# Patient Record
Sex: Female | Born: 1980
Health system: Southern US, Community
[De-identification: ages and names within clinical notes are randomized; demographics above are authoritative.]

## PROBLEM LIST (undated history)

## (undated) DIAGNOSIS — M545 Low back pain, unspecified: Secondary | ICD-10-CM

## (undated) DIAGNOSIS — F32A Depression, unspecified: Secondary | ICD-10-CM

## (undated) DIAGNOSIS — N289 Disorder of kidney and ureter, unspecified: Secondary | ICD-10-CM

## (undated) DIAGNOSIS — F419 Anxiety disorder, unspecified: Secondary | ICD-10-CM

## (undated) HISTORY — DX: Low back pain: M54.5

## (undated) HISTORY — PX: MANDIBLE FRACTURE SURGERY: SHX706

## (undated) HISTORY — PX: HIP SURGERY: SHX245

## (undated) HISTORY — DX: Low back pain, unspecified: M54.50

## (undated) HISTORY — DX: Depression, unspecified: F32.A

## (undated) HISTORY — DX: Anxiety disorder, unspecified: F41.9

## (undated) HISTORY — PX: JOINT REPLACEMENT: SHX530

---
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
## 2002-09-03 ENCOUNTER — Encounter: Payer: Self-pay | Admitting: General Surgery

## 2002-09-03 ENCOUNTER — Inpatient Hospital Stay (HOSPITAL_COMMUNITY): Admission: AC | Admit: 2002-09-03 | Discharge: 2002-09-12 | Payer: Self-pay

## 2002-09-07 ENCOUNTER — Encounter: Payer: Self-pay | Admitting: Orthopedic Surgery

## 2003-06-30 ENCOUNTER — Inpatient Hospital Stay (HOSPITAL_COMMUNITY): Admission: RE | Admit: 2003-06-30 | Discharge: 2003-07-03 | Payer: Self-pay | Admitting: Psychiatry

## 2003-07-27 ENCOUNTER — Other Ambulatory Visit: Admission: RE | Admit: 2003-07-27 | Discharge: 2003-07-27 | Payer: Self-pay | Admitting: Internal Medicine

## 2003-11-08 ENCOUNTER — Emergency Department (HOSPITAL_COMMUNITY): Admission: EM | Admit: 2003-11-08 | Discharge: 2003-11-08 | Payer: Self-pay | Admitting: Emergency Medicine

## 2004-04-20 ENCOUNTER — Other Ambulatory Visit: Admission: RE | Admit: 2004-04-20 | Discharge: 2004-04-20 | Payer: Self-pay | Admitting: Obstetrics and Gynecology

## 2004-08-03 ENCOUNTER — Inpatient Hospital Stay (HOSPITAL_COMMUNITY): Admission: AD | Admit: 2004-08-03 | Discharge: 2004-08-03 | Payer: Self-pay | Admitting: Obstetrics and Gynecology

## 2004-08-04 ENCOUNTER — Ambulatory Visit (HOSPITAL_COMMUNITY): Admission: RE | Admit: 2004-08-04 | Discharge: 2004-08-04 | Payer: Self-pay | Admitting: Obstetrics and Gynecology

## 2004-08-06 ENCOUNTER — Inpatient Hospital Stay (HOSPITAL_COMMUNITY): Admission: AD | Admit: 2004-08-06 | Discharge: 2004-08-06 | Payer: Self-pay | Admitting: Obstetrics and Gynecology

## 2004-08-23 ENCOUNTER — Observation Stay: Payer: Self-pay | Admitting: Unknown Physician Specialty

## 2004-10-30 ENCOUNTER — Inpatient Hospital Stay (HOSPITAL_COMMUNITY): Admission: AD | Admit: 2004-10-30 | Discharge: 2004-10-30 | Payer: Self-pay | Admitting: Obstetrics and Gynecology

## 2004-11-04 ENCOUNTER — Inpatient Hospital Stay (HOSPITAL_COMMUNITY): Admission: AD | Admit: 2004-11-04 | Discharge: 2004-11-04 | Payer: Self-pay | Admitting: Obstetrics and Gynecology

## 2004-11-06 ENCOUNTER — Inpatient Hospital Stay (HOSPITAL_COMMUNITY): Admission: AD | Admit: 2004-11-06 | Discharge: 2004-11-10 | Payer: Self-pay | Admitting: Obstetrics and Gynecology

## 2004-11-07 ENCOUNTER — Encounter (INDEPENDENT_AMBULATORY_CARE_PROVIDER_SITE_OTHER): Payer: Self-pay | Admitting: Specialist

## 2005-06-19 ENCOUNTER — Other Ambulatory Visit: Admission: RE | Admit: 2005-06-19 | Discharge: 2005-06-19 | Payer: Self-pay | Admitting: Obstetrics and Gynecology

## 2005-07-06 ENCOUNTER — Ambulatory Visit: Payer: Self-pay | Admitting: Internal Medicine

## 2005-07-08 ENCOUNTER — Emergency Department (HOSPITAL_COMMUNITY): Admission: EM | Admit: 2005-07-08 | Discharge: 2005-07-09 | Payer: Self-pay | Admitting: Emergency Medicine

## 2005-07-10 ENCOUNTER — Ambulatory Visit: Payer: Self-pay | Admitting: Hospitalist

## 2005-08-29 ENCOUNTER — Encounter: Payer: Self-pay | Admitting: Emergency Medicine

## 2005-10-11 ENCOUNTER — Ambulatory Visit: Payer: Self-pay | Admitting: Internal Medicine

## 2007-08-18 ENCOUNTER — Emergency Department (HOSPITAL_COMMUNITY): Admission: EM | Admit: 2007-08-18 | Discharge: 2007-08-19 | Payer: Self-pay | Admitting: Emergency Medicine

## 2010-04-18 ENCOUNTER — Emergency Department: Payer: Self-pay | Admitting: Emergency Medicine

## 2010-07-20 ENCOUNTER — Emergency Department (HOSPITAL_COMMUNITY)
Admission: EM | Admit: 2010-07-20 | Discharge: 2010-07-20 | Disposition: A | Discharge status: Home / Self Care | Payer: Medicaid Other | Attending: Emergency Medicine | Admitting: Emergency Medicine

## 2010-07-20 ENCOUNTER — Emergency Department: Payer: Self-pay | Admitting: Unknown Physician Specialty

## 2010-07-20 DIAGNOSIS — T6391XA Toxic effect of contact with unspecified venomous animal, accidental (unintentional), initial encounter: Secondary | ICD-10-CM | POA: Insufficient documentation

## 2010-07-20 DIAGNOSIS — IMO0002 Reserved for concepts with insufficient information to code with codable children: Secondary | ICD-10-CM | POA: Insufficient documentation

## 2010-07-20 DIAGNOSIS — T63461A Toxic effect of venom of wasps, accidental (unintentional), initial encounter: Secondary | ICD-10-CM | POA: Insufficient documentation

## 2010-07-20 DIAGNOSIS — M7989 Other specified soft tissue disorders: Secondary | ICD-10-CM | POA: Insufficient documentation

## 2010-07-20 DIAGNOSIS — M79609 Pain in unspecified limb: Secondary | ICD-10-CM | POA: Insufficient documentation
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
## 2010-09-15 NOTE — Op Note (Signed)
[name], [name]                ACCOUNT NO.:  [id number]   MEDICAL RECORD NO.:  [id number]          PATIENT TYPE:  INP   LOCATION:  9105                          FACILITY:  [facility]   PHYSICIAN:  Osborn Coho, M.D.   DATE OF BIRTH:  1981-04-10   DATE OF PROCEDURE:  11/07/2004  DATE OF DISCHARGE:                                 OPERATIVE REPORT   PREOPERATIVE DIAGNOSES:  1.  Intrauterine pregnancy.  2.  Failure of descent.  3.  Chorioamnionitis.  4.  Fetal tachycardia.   POSTOPERATIVE DIAGNOSES:  1.  Intrauterine pregnancy.  2.  Failure of descent.  3.  Chorioamnionitis.  4.  Fetal tachycardia.   OPERATION/PROCEDURE:  Primary low transverse cesarean section via  Pfannenstiel skin incision.   ANESTHESIA:  Epidural.   SURGEON:  Osborn Coho, M.D.   ASSISTANT:  Cam Hai, C.N.M.   IV FLUIDS:  2600 mL.   ESTIMATED BLOOD LOSS:  750 mL.   URINARY OUTPUT:  300 mL.   COMPLICATIONS:  None.   FINDINGS:  Live female infant with Apgars of 9 at one minute and 9 at five  minutes, [name], normal-appearing bilateral ovaries and fallopian  tubes, weight 9 pounds 1 ounce.   DESCRIPTION OF PROCEDURE:  The patient the taken to the operating room after  the risks, benefits and alternatives had been discussed with the patient.  The patient verbalized her understanding and consent was signed and  witnessed.  The patient was prepped and draped and given a surgical level of  anesthesia via the epidural.  A Pfannenstiel skin incision was made and  carried down to the underlying layer of fascia with the Bovie.  The fascia  was excised bilaterally in the midline with the Bovie and extended  bilaterally with the Mayo scissors. Kocher clamps were placed on the  inferior aspect of the fascial incision and the rectus muscle excised from  the fascia.  The same was done on the superior aspect of the fascial  incision.  The muscle was separated in the midline and the peritoneum  entered bluntly and extended manually.  Bladder flap created with the  Metzenbaum scissors after bladder blade placed for retraction.  Uterine  incision was made with the scalpel and extended bilaterally with the bandage  scissors.  Infant was delivered in the [facility] presentation.  The infant's  oropharynx and nasopharynx were bulb suctioned.  The cord clamped and cut  after the infant was delivered.  The infant was handed to the waiting  pediatricians.  The uterus was cleared of all clots and debris after the  placenta was delivered via fundal massage.  Cord bloods were collected.  The  uterine incision was repaired with 0 Vicryl in a running locked fashion and  a second imbricating layer was performed.  The peritoneum was repaired with  3-0 chromic in a running fashion.  The fascia was repaired with 0 Vicryl in  a running fashion.  The subcutaneous tissue was irrigated and made  hemostatic with the Bovie.  The skin was closed with staples.  The patient  tolerated the  procedure well and is currently being transferred to the  recovery room in good condition.       AR/MEDQ  D:  11/07/2004  T:  11/08/2004  Job:  [id number]
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
## 2010-09-15 NOTE — Discharge Summary (Signed)
[name], [name]                ACCOUNT NO.:  [id number]   MEDICAL RECORD NO.:  [id number]          PATIENT TYPE:  INP   LOCATION:  9105                          FACILITY:  [facility]   PHYSICIAN:  Hal Morales, M.D.DATE OF BIRTH:  Sep 13, 1980   DATE OF ADMISSION:  11/06/2004  DATE OF DISCHARGE:                                 DISCHARGE SUMMARY   ADMISSION DIAGNOSES:  1.  Intrauterine pregnancy at 40 weeks.  2.  Pregnancy-induced hypertension.  3.  Group B streptococcus negative.   DISCHARGE DIAGNOSES:  1.  Intrauterine pregnancy at 40 weeks.  2.  Pregnancy-induced hypertension.  3.  Group B streptococcus negative.  4.  Chorioamnionitis.  5.  Fetal tachycardia.  6.  Failure to descend.  7.  Status post primary low transverse cesarean section of a female infant      named [name], Apgars 9 and 9, weighing 9 pounds 1 ounce.   HOSPITAL PROCEDURES:  1.  Cervidil cervical ripening.  2.  Pitocin induction of labor.  3.  Electronic fetal monitoring.  4.  Epidural anesthesia.  5.  Primary low transverse cesarean section.   HOSPITAL COURSE:  The patient was admitted secondary to pregnancy-induced  hypertension. She had mild elevations of her blood pressure, 140/90. A 24-  hour urine was negative. Cervix on admission was 1-2, 75%, and -2. She was  given Cervidil overnight to ripen the cervix, followed by Pitocin the next  morning. She progressed to complete dilation and pushed for 2 hours with no  descent past +2 station. At that time she developed a fever, fetal  tachycardia, and decision was made to proceed with an elective primary low  transverse cesarean section which was performed under epidural anesthesia by  Dr. Su Hilt with no complications. Estimated blood loss was 750 mL.  On  postoperative day #1 she was doing well, baby was bottle feeding. She was  getting out of bed without any dizziness. Hemoglobin was 8.4. Orthostatic  blood pressures were within normal limits. On  postoperative day #2 she was  improving, getting up out of bed more. Blood pressure was normal, 113/63,  and routine care was continued. On postoperative day #3 she was ready to go  home. She was requesting Ambien for a history of recent insomnia. She was  bottle-feeding her infant and had plans for her husband to get up with the  baby at night. Vital signs were stable. Chest clear. Heart rate regular rate  and rhythm. Abdomen soft and appropriately tender. Incision was clean with  staples and intact. Lochia was small. Extremities within normal limits. She  was deemed to have received the full benefit of her hospital stay and was  discharged home.   CONDITION AT DISCHARGE:  Good.   DISCHARGE MEDICATIONS:  1.  Motrin 600 mg p.o. q.6h. p.r.n.  2.  Tylox one to two p.o. q.4h. p.r.n.  3.  Ambien one p.o. q.h.s. p.r.n. #10 no refills.   DISCHARGE LABORATORY DATA:  White blood cell count 12.1, hemoglobin 8.4,  platelets 165.   DISCHARGE INSTRUCTIONS:  Per [facility] handout.  DISCHARGE FOLLOW-UP:  In 6 weeks or p.r.n.       MLW/MEDQ  D:  11/10/2004  T:  11/10/2004  Job:  [id number]
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
## 2010-12-25 ENCOUNTER — Emergency Department (HOSPITAL_COMMUNITY)
Admission: EM | Admit: 2010-12-25 | Discharge: 2010-12-25 | Disposition: A | Discharge status: Home / Self Care | Payer: Medicaid Other | Attending: Emergency Medicine | Admitting: Emergency Medicine

## 2010-12-25 DIAGNOSIS — R109 Unspecified abdominal pain: Secondary | ICD-10-CM | POA: Insufficient documentation

## 2010-12-25 DIAGNOSIS — Z87442 Personal history of urinary calculi: Secondary | ICD-10-CM | POA: Insufficient documentation

## 2010-12-25 DIAGNOSIS — R110 Nausea: Secondary | ICD-10-CM | POA: Insufficient documentation

## 2010-12-25 DIAGNOSIS — N200 Calculus of kidney: Secondary | ICD-10-CM | POA: Insufficient documentation

## 2010-12-25 LAB — URINALYSIS, ROUTINE W REFLEX MICROSCOPIC
Bilirubin Urine: NEGATIVE
Glucose, UA: NEGATIVE mg/dL
Ketones, ur: NEGATIVE mg/dL
Nitrite: NEGATIVE
Protein, ur: 30 mg/dL — AB
Specific Gravity, Urine: 1.027 (ref 1.005–1.030)
Urobilinogen, UA: 1 mg/dL (ref 0.0–1.0)
pH: 7.5 (ref 5.0–8.0)

## 2010-12-25 LAB — POCT PREGNANCY, URINE: Preg Test, Ur: NEGATIVE

## 2010-12-25 LAB — POCT I-STAT, CHEM 8
BUN: 9 mg/dL (ref 6–23)
Calcium, Ion: 1.24 mmol/L (ref 1.12–1.32)
Chloride: 105 mEq/L (ref 96–112)
Creatinine, Ser: 0.7 mg/dL (ref 0.50–1.10)
Glucose, Bld: 85 mg/dL (ref 70–99)
HCT: 37 % (ref 36.0–46.0)
Hemoglobin: 12.6 g/dL (ref 12.0–15.0)
Potassium: 4.3 mEq/L (ref 3.5–5.1)
Sodium: 142 mEq/L (ref 135–145)
TCO2: 26 mmol/L (ref 0–100)

## 2010-12-25 LAB — URINE MICROSCOPIC-ADD ON

## 2011-01-23 LAB — URINALYSIS, ROUTINE W REFLEX MICROSCOPIC
Bilirubin Urine: NEGATIVE
Glucose, UA: NEGATIVE
Ketones, ur: 15 — AB
Nitrite: NEGATIVE
Protein, ur: NEGATIVE
Specific Gravity, Urine: 1.02
Urobilinogen, UA: 1
pH: 8

## 2011-01-23 LAB — URINE MICROSCOPIC-ADD ON

## 2011-01-23 LAB — DIFFERENTIAL
Basophils Absolute: 0
Basophils Relative: 0
Eosinophils Absolute: 0
Eosinophils Relative: 0
Lymphocytes Relative: 11 — ABNORMAL LOW
Lymphs Abs: 1.1
Monocytes Absolute: 0.4
Monocytes Relative: 4
Neutro Abs: 9.2 — ABNORMAL HIGH
Neutrophils Relative %: 85 — ABNORMAL HIGH

## 2011-01-23 LAB — CBC
HCT: 32.6 — ABNORMAL LOW
Hemoglobin: 11.2 — ABNORMAL LOW
MCHC: 34.2
MCV: 88
Platelets: 205
RBC: 3.71 — ABNORMAL LOW
RDW: 12.9
WBC: 10.8 — ABNORMAL HIGH

## 2011-01-23 LAB — WET PREP, GENITAL
Clue Cells Wet Prep HPF POC: NONE SEEN
Trich, Wet Prep: NONE SEEN
WBC, Wet Prep HPF POC: NONE SEEN
Yeast Wet Prep HPF POC: NONE SEEN

## 2011-01-23 LAB — BASIC METABOLIC PANEL
BUN: 7
CO2: 26
Calcium: 8.5
Chloride: 106
Creatinine, Ser: 0.74
GFR calc Af Amer: >60
GFR calc non Af Amer: >60
Glucose, Bld: 123 — ABNORMAL HIGH
Potassium: 3.1 — ABNORMAL LOW
Sodium: 141

## 2011-01-23 LAB — GC/CHLAMYDIA PROBE AMP, GENITAL
Chlamydia, DNA Probe: NEGATIVE
GC Probe Amp, Genital: NEGATIVE

## 2011-01-23 LAB — POCT PREGNANCY, URINE
Operator id: 24446
Preg Test, Ur: NEGATIVE

## 2011-07-11 ENCOUNTER — Ambulatory Visit (INDEPENDENT_AMBULATORY_CARE_PROVIDER_SITE_OTHER): Payer: Medicaid Other | Admitting: Obstetrics and Gynecology

## 2011-07-11 DIAGNOSIS — Z0000 Encounter for general adult medical examination without abnormal findings: Secondary | ICD-10-CM

## 2011-07-11 DIAGNOSIS — N898 Other specified noninflammatory disorders of vagina: Secondary | ICD-10-CM

## 2011-12-22 ENCOUNTER — Emergency Department (HOSPITAL_COMMUNITY)
Admission: EM | Admit: 2011-12-22 | Discharge: 2011-12-23 | Disposition: A | Discharge status: Home / Self Care | Payer: Medicaid Other | Attending: Emergency Medicine | Admitting: Emergency Medicine

## 2011-12-22 ENCOUNTER — Encounter (HOSPITAL_COMMUNITY): Payer: Self-pay | Admitting: Emergency Medicine

## 2011-12-22 DIAGNOSIS — M5431 Sciatica, right side: Secondary | ICD-10-CM

## 2011-12-22 DIAGNOSIS — N289 Disorder of kidney and ureter, unspecified: Secondary | ICD-10-CM | POA: Insufficient documentation

## 2011-12-22 DIAGNOSIS — F17200 Nicotine dependence, unspecified, uncomplicated: Secondary | ICD-10-CM | POA: Insufficient documentation

## 2011-12-22 DIAGNOSIS — M5430 Sciatica, unspecified side: Secondary | ICD-10-CM | POA: Insufficient documentation

## 2011-12-22 HISTORY — DX: Disorder of kidney and ureter, unspecified: N28.9

## 2011-12-22 NOTE — ED Notes (Signed)
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

## 2011-12-23 MED ORDER — IBUPROFEN 800 MG PO TABS: 400.0000 mg | ORAL_TABLET | Freq: Three times a day (TID) | ORAL | Status: AC

## 2011-12-23 MED ORDER — OXYCODONE-ACETAMINOPHEN 5-325 MG PO TABS
2.0000 | ORAL_TABLET | Freq: Once | ORAL | Status: AC
  Administered 2011-12-23: 2 via ORAL
  Filled 2011-12-23: qty 2

## 2011-12-23 MED ORDER — OXYCODONE-ACETAMINOPHEN 5-325 MG PO TABS: 2.0000 | ORAL_TABLET | ORAL | Status: AC | PRN
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
## 2011-12-23 NOTE — ED Provider Notes (Signed)
Medical screening examination/treatment/procedure(s) were performed by non-physician practitioner and as supervising physician I was immediately available for consultation/collaboration.   Lyanne Co, MD 12/23/11 0110

## 2012-07-02 ENCOUNTER — Telehealth: Payer: Self-pay

## 2012-07-02 NOTE — Telephone Encounter (Signed)
Ok to [employer] FE tabs X2 called to [facility] [address] Melody Comas A

## 2012-07-04 ENCOUNTER — Emergency Department: Payer: Self-pay | Admitting: Emergency Medicine

## 2012-07-04 LAB — ETHANOL
Ethanol %: <0.003 % (ref 0.000–0.080)
Ethanol: <3 mg/dL

## 2012-07-04 LAB — DRUG SCREEN, URINE
Amphetamines, Ur Screen: NEGATIVE (ref ?–1000)
Barbiturates, Ur Screen: NEGATIVE (ref ?–200)
Benzodiazepine, Ur Scrn: NEGATIVE (ref ?–200)
Cannabinoid 50 Ng, Ur ~~LOC~~: NEGATIVE (ref ?–50)
Cocaine Metabolite,Ur ~~LOC~~: NEGATIVE (ref ?–300)
MDMA (Ecstasy)Ur Screen: NEGATIVE (ref ?–500)
Methadone, Ur Screen: NEGATIVE (ref ?–300)
Opiate, Ur Screen: POSITIVE (ref ?–300)
Phencyclidine (PCP) Ur S: NEGATIVE (ref ?–25)
Tricyclic, Ur Screen: NEGATIVE (ref ?–1000)

## 2012-09-19 ENCOUNTER — Other Ambulatory Visit: Payer: Self-pay | Admitting: Orthopedic Surgery

## 2012-09-19 DIAGNOSIS — M25572 Pain in left ankle and joints of left foot: Secondary | ICD-10-CM

## 2012-09-24 ENCOUNTER — Ambulatory Visit
Admission: RE | Admit: 2012-09-24 | Discharge: 2012-09-24 | Disposition: A | Discharge status: Home / Self Care | Payer: Medicaid Other | Source: Ambulatory Visit | Attending: Orthopedic Surgery | Admitting: Orthopedic Surgery

## 2012-09-24 DIAGNOSIS — M25572 Pain in left ankle and joints of left foot: Secondary | ICD-10-CM

## 2014-04-30 LAB — HM PAP SMEAR: HM Pap smear: NORMAL

## 2015-04-20 ENCOUNTER — Encounter: Payer: Self-pay | Admitting: Internal Medicine

## 2015-04-20 ENCOUNTER — Ambulatory Visit (INDEPENDENT_AMBULATORY_CARE_PROVIDER_SITE_OTHER): Payer: 59 | Admitting: Internal Medicine

## 2015-04-20 VITALS — BP 118/68 | HR 71 | Temp 98.0°F | Resp 20 | Ht 66.5 in | Wt 140.8 lb

## 2015-04-20 DIAGNOSIS — Z716 Tobacco abuse counseling: Secondary | ICD-10-CM | POA: Diagnosis not present

## 2015-04-20 DIAGNOSIS — Z3041 Encounter for surveillance of contraceptive pills: Secondary | ICD-10-CM | POA: Diagnosis not present

## 2015-04-20 DIAGNOSIS — Z720 Tobacco use: Secondary | ICD-10-CM | POA: Diagnosis not present

## 2015-04-20 DIAGNOSIS — M25552 Pain in left hip: Secondary | ICD-10-CM | POA: Diagnosis not present

## 2015-04-20 DIAGNOSIS — R200 Anesthesia of skin: Secondary | ICD-10-CM

## 2015-04-20 DIAGNOSIS — M545 Low back pain, unspecified: Secondary | ICD-10-CM

## 2015-04-20 DIAGNOSIS — R208 Other disturbances of skin sensation: Secondary | ICD-10-CM

## 2015-04-20 DIAGNOSIS — G8929 Other chronic pain: Secondary | ICD-10-CM
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
## 2015-04-20 NOTE — Patient Instructions (Signed)
Continue current medications as ordered  Will call with lab results  Get old records from previous PCP  Follow up in 1 mo for CPE

## 2015-04-21 LAB — CBC WITH DIFFERENTIAL/PLATELET
Basophils Absolute: 0 10*3/uL (ref 0.0–0.2)
Basos: 1 %
EOS (ABSOLUTE): 0.2 10*3/uL (ref 0.0–0.4)
Eos: 3 %
Hematocrit: 41.2 % (ref 34.0–46.6)
Hemoglobin: 13.9 g/dL (ref 11.1–15.9)
Immature Grans (Abs): 0 10*3/uL (ref 0.0–0.1)
Immature Granulocytes: 0 %
Lymphocytes Absolute: 1.6 10*3/uL (ref 0.7–3.1)
Lymphs: 25 %
MCH: 30 pg (ref 26.6–33.0)
MCHC: 33.7 g/dL (ref 31.5–35.7)
MCV: 89 fL (ref 79–97)
Monocytes Absolute: 0.4 10*3/uL (ref 0.1–0.9)
Monocytes: 7 %
Neutrophils Absolute: 4.2 10*3/uL (ref 1.4–7.0)
Neutrophils: 64 %
Platelets: 237 10*3/uL (ref 150–379)
RBC: 4.63 x10E6/uL (ref 3.77–5.28)
RDW: 13.4 % (ref 12.3–15.4)
WBC: 6.5 10*3/uL (ref 3.4–10.8)

## 2015-04-21 LAB — LIPID PANEL
Chol/HDL Ratio: 3 ratio units (ref 0.0–4.4)
Cholesterol, Total: 151 mg/dL (ref 100–199)
HDL: 51 mg/dL (ref >39)
LDL Calculated: 75 mg/dL (ref 0–99)
Triglycerides: 124 mg/dL (ref 0–149)
VLDL Cholesterol Cal: 25 mg/dL (ref 5–40)

## 2015-04-21 LAB — COMPREHENSIVE METABOLIC PANEL
ALT: 17 IU/L (ref 0–32)
AST: 14 IU/L (ref 0–40)
Albumin/Globulin Ratio: 1.9 (ref 1.1–2.5)
Albumin: 4.1 g/dL (ref 3.5–5.5)
Alkaline Phosphatase: 66 IU/L (ref 39–117)
BUN/Creatinine Ratio: 11 (ref 8–20)
BUN: 7 mg/dL (ref 6–20)
Bilirubin Total: 0.5 mg/dL (ref 0.0–1.2)
CO2: 26 mmol/L (ref 18–29)
Calcium: 9 mg/dL (ref 8.7–10.2)
Chloride: 101 mmol/L (ref 96–106)
Creatinine, Ser: 0.64 mg/dL (ref 0.57–1.00)
GFR calc Af Amer: 137 mL/min/{1.73_m2} (ref >59)
GFR calc non Af Amer: 118 mL/min/{1.73_m2} (ref >59)
Globulin, Total: 2.2 g/dL (ref 1.5–4.5)
Glucose: 90 mg/dL (ref 65–99)
Potassium: 4 mmol/L (ref 3.5–5.2)
Sodium: 141 mmol/L (ref 134–144)
Total Protein: 6.3 g/dL (ref 6.0–8.5)

## 2015-04-21 LAB — TSH: TSH: 2.64 u[IU]/mL (ref 0.450–4.500)

## 2015-05-04 ENCOUNTER — Ambulatory Visit: Payer: Self-pay | Admitting: Internal Medicine

## 2015-06-15 ENCOUNTER — Encounter: Payer: Self-pay | Admitting: Internal Medicine

## 2015-06-15 ENCOUNTER — Ambulatory Visit (INDEPENDENT_AMBULATORY_CARE_PROVIDER_SITE_OTHER): Payer: 59 | Admitting: Internal Medicine

## 2015-06-15 VITALS — BP 98/62 | HR 70 | Temp 97.7°F | Resp 18 | Ht 67.0 in | Wt 141.2 lb

## 2015-06-15 DIAGNOSIS — G8929 Other chronic pain: Secondary | ICD-10-CM | POA: Diagnosis not present

## 2015-06-15 DIAGNOSIS — R200 Anesthesia of skin: Secondary | ICD-10-CM | POA: Insufficient documentation

## 2015-06-15 DIAGNOSIS — Z0000 Encounter for general adult medical examination without abnormal findings: Secondary | ICD-10-CM

## 2015-06-15 DIAGNOSIS — M545 Low back pain, unspecified: Secondary | ICD-10-CM

## 2015-06-15 DIAGNOSIS — R208 Other disturbances of skin sensation: Secondary | ICD-10-CM

## 2015-06-15 DIAGNOSIS — Z720 Tobacco use: Secondary | ICD-10-CM

## 2015-06-15 DIAGNOSIS — F410 Panic disorder [episodic paroxysmal anxiety] without agoraphobia: Secondary | ICD-10-CM | POA: Diagnosis not present

## 2015-06-15 DIAGNOSIS — M25552 Pain in left hip: Secondary | ICD-10-CM | POA: Diagnosis not present

## 2015-06-15 DIAGNOSIS — Z3041 Encounter for surveillance of contraceptive pills: Secondary | ICD-10-CM | POA: Insufficient documentation

## 2015-06-15 MED ORDER — OXYCODONE HCL 15 MG PO TABS: 15.0000 mg | ORAL_TABLET | ORAL | Status: DC | PRN

## 2015-06-15 NOTE — Patient Instructions (Signed)
Pt is UTD on health maintenance. Vaccinations are UTD. Pt maintains a healthy lifestyle. Encouraged pt to exercise 30-45 minutes 4-5 times per week. Eat a well balanced diet. Avoid smoking. Limit alcohol intake. Wear seatbelt when riding in the car. Wear sun block (SPF >50) when spending extended times outside.  Pain contract discussed and signed  Continue current medications as ordered  Smoking cessation discussed and highly urged  Follow up with GYN as scheduled  Follow up in 6 mos for routine visit
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
## 2015-07-06 ENCOUNTER — Encounter: Payer: Self-pay | Admitting: Internal Medicine

## 2015-07-06 ENCOUNTER — Ambulatory Visit (INDEPENDENT_AMBULATORY_CARE_PROVIDER_SITE_OTHER): Payer: 59 | Admitting: Internal Medicine

## 2015-07-06 VITALS — BP 134/80 | HR 81 | Temp 98.0°F | Ht 67.0 in | Wt 138.6 lb

## 2015-07-06 DIAGNOSIS — Z20828 Contact with and (suspected) exposure to other viral communicable diseases: Secondary | ICD-10-CM

## 2015-07-06 DIAGNOSIS — M545 Low back pain, unspecified: Secondary | ICD-10-CM

## 2015-07-06 DIAGNOSIS — J069 Acute upper respiratory infection, unspecified: Secondary | ICD-10-CM

## 2015-07-06 DIAGNOSIS — R509 Fever, unspecified: Secondary | ICD-10-CM | POA: Diagnosis not present

## 2015-07-06 DIAGNOSIS — G8929 Other chronic pain: Secondary | ICD-10-CM | POA: Diagnosis not present

## 2015-07-06 LAB — POCT INFLUENZA A/B
Influenza A, POC: NEGATIVE
Influenza B, POC: NEGATIVE

## 2015-07-06 MED ORDER — OSELTAMIVIR PHOSPHATE 75 MG PO CAPS: 75.0000 mg | ORAL_CAPSULE | Freq: Every day | ORAL | Status: DC

## 2015-07-06 NOTE — Progress Notes (Signed)
Patient ID: [name], female   DOB: 03/13/1981, 35 y.o.   MRN: [id number]    Location:    [facility]   Place of Service:   OFFICE  Chief Complaint  Patient presents with  . Acute Visit    Patient's daughter was a confirmed case of the flu. Patient c/o drainage, scratchy throat, body aches, ear pressure, and fever of 100.0 yesterday (now resolved)     HPI:  35 yo female seen today for f/u. She reports exposure to the flu. Her daughter was dx 3 days ago. Pt reports URI sx's x 2 days. tmax 100.24F. She c/o ear pressure, post nasal drip, cough. Denies CP, SOB, nausea, nasal or sinus congestion, HA, dizziness. She tried OTC claritin and ibuprofen with some relief.   She c/o LBP despite chronic pain meds. She would like to change pharmacies as hers runs out of med frequently.   Past Medical History  Diagnosis Date  . Renal disorder   . Lower back pain     Past Surgical History  Procedure Laterality Date  . Joint replacement    . Mandible fracture surgery    . Hip surgery      Patient Care Team: Osborn CohoAngela Roberts, MD as PCP - General (Obstetrics and Gynecology)  Social History   Social History  . Marital Status: Single    Spouse Name: N/A  . Number of Children: N/A  . Years of Education: N/A   Occupational History  . Not on file.   Social History Main Topics  . Smoking status: Current Every Day Smoker -- 1.00 packs/day    Types: Cigarettes  . Smokeless tobacco: Never Used  . Alcohol Use: No  . Drug Use: No  . Sexual Activity: Not on file   Other Topics Concern  . Not on file   Social History Narrative   Diet:       Do you drink/ eat things with caffeine? Yes      Marital status:    Single                           What year were you married ?       Do you live in a house, apartment,assistred living, condo, trailer, etc.)? Apartment      Is it one or more stories? Yes      How many persons live in your home ? Me and my daughter      Do you have any pets in your  home ?(please list) No      Current or past profession: CNA/Dispatcher       Do you exercise?                              Type & how often:       Do you have a living will?       Do you have a DNR form?                       If not, do you want to discuss one?       Do you have signed POA?HPOA forms?                 If so, please bring to your        appointment  reports that she has been smoking Cigarettes.  She has been smoking about 1.00 pack per day. She has never used smokeless tobacco. She reports that she does not drink alcohol or use illicit drugs.  Allergies  Allergen Reactions  . Hydrocodone-Acetaminophen Rash  . Zithromax [Azithromycin] Rash    Medications: Patient's Medications  New Prescriptions   No medications on file  Previous Medications   ESCITALOPRAM (LEXAPRO) 20 MG TABLET    Take 20 mg by mouth daily.   METHOCARBAMOL (ROBAXIN) 500 MG TABLET    Take 500 mg by mouth 2 (two) times daily.   NORETHIN-ETH ESTRADIOL-FE (WYMZYA FE PO)    Take 1 tablet by mouth daily   OXYCODONE (ROXICODONE) 15 MG IMMEDIATE RELEASE TABLET    Take 1 tablet (15 mg total) by mouth every 4 (four) hours as needed for pain.  Modified Medications   No medications on file  Discontinued Medications   No medications on file    Review of Systems  Constitutional: Positive for fever.  HENT: Positive for postnasal drip.   Respiratory: Positive for cough.   Musculoskeletal: Positive for back pain and arthralgias.  All other systems reviewed and are negative.   Filed Vitals:   07/06/15 1148  BP: 134/80  Pulse: 81  Temp: 98 F (36.7 C)  TempSrc: Oral  Height:  (1.702 m)  Weight: 138 lb 9.6 oz (62.869 kg)  SpO2: 98%   Body mass index is 21.7 kg/(m^2).  Physical Exam  Constitutional: She is oriented to person, place, and time. She appears well-developed and well-nourished.  HENT:  TMs appear nml. No sinus TTP. Nares patent. Oropharynx cobblestoning  and red but no exudate.   Eyes: Pupils are equal, round, and reactive to light. No scleral icterus.  Neck: Neck supple. Carotid bruit is not present. No tracheal deviation present.  Cardiovascular: Normal rate, regular rhythm, normal heart sounds and intact distal pulses.  Exam reveals no gallop and no friction rub.   No murmur heard. No LE edema b/l. no calf TTP.   Pulmonary/Chest: Effort normal and breath sounds normal. No stridor. No respiratory distress. She has no wheezes. She has no rales. She exhibits no tenderness.  Abdominal: There is no hepatomegaly.  Musculoskeletal: She exhibits edema and tenderness (right lower back with paravertebral lumbar muscle hypertrophy with ropy tissue texture changes).  Lymphadenopathy:    She has no cervical adenopathy.  Neurological: She is alert and oriented to person, place, and time. She has normal reflexes.  Skin: Skin is warm and dry. No rash noted.  Psychiatric: She has a normal mood and affect. Her behavior is normal. Thought content normal.     Labs reviewed: Office Visit on 07/06/2015  Component Date Value Ref Range Status  . Influenza A, POC 07/06/2015 Negative  Negative Final  . Influenza B, POC 07/06/2015 Negative  Negative Final  . HM Pap smear 04/30/2014 Normal per patient    Final  Office Visit on 04/20/2015  Component Date Value Ref Range Status  . WBC 04/20/2015 6.5  3.4 - 10.8 x10E3/uL Final  . RBC 04/20/2015 4.63  3.77 - 5.28 x10E6/uL Final  . Hemoglobin 04/20/2015 13.9  11.1 - 15.9 g/dL Final  . Hematocrit 16/01/9603 41.2  34.0 - 46.6 % Final  . MCV 04/20/2015 89  79 - 97 fL Final  . MCH 04/20/2015 30.0  26.6 - 33.0 pg Final  . MCHC 04/20/2015 33.7  31.5 - 35.7 g/dL Final  . RDW 54/12/8117 13.4  12.3 -  15.4 % Final  . Platelets 04/20/2015 237  150 - 379 x10E3/uL Final  . Neutrophils 04/20/2015 64   Final  . Lymphs 04/20/2015 25   Final  . Monocytes 04/20/2015 7   Final  . Eos 04/20/2015 3   Final  . Basos 04/20/2015 1    Final  . Neutrophils Absolute 04/20/2015 4.2  1.4 - 7.0 x10E3/uL Final  . Lymphocytes Absolute 04/20/2015 1.6  0.7 - 3.1 x10E3/uL Final  . Monocytes Absolute 04/20/2015 0.4  0.1 - 0.9 x10E3/uL Final  . EOS (ABSOLUTE) 04/20/2015 0.2  0.0 - 0.4 x10E3/uL Final  . Basophils Absolute 04/20/2015 0.0  0.0 - 0.2 x10E3/uL Final  . Immature Granulocytes 04/20/2015 0   Final  . Immature Grans (Abs) 04/20/2015 0.0  0.0 - 0.1 x10E3/uL Final  . Glucose 04/20/2015 90  65 - 99 mg/dL Final  . BUN 16/01/9603 7  6 - 20 mg/dL Final  . Creatinine, Ser 04/20/2015 0.64  0.57 - 1.00 mg/dL Final  . GFR calc non Af Amer 04/20/2015 118  >59 mL/min/1.73 Final  . GFR calc Af Amer 04/20/2015 137  >59 mL/min/1.73 Final  . BUN/Creatinine Ratio 04/20/2015 11  8 - 20 Final  . Sodium 04/20/2015 141  134 - 144 mmol/L Final                 **Please note reference interval change**  . Potassium 04/20/2015 4.0  3.5 - 5.2 mmol/L Final  . Chloride 04/20/2015 101  96 - 106 mmol/L Final                 **Please note reference interval change**  . CO2 04/20/2015 26  18 - 29 mmol/L Final  . Calcium 04/20/2015 9.0  8.7 - 10.2 mg/dL Final  . Total Protein 04/20/2015 6.3  6.0 - 8.5 g/dL Final  . Albumin 54/12/8117 4.1  3.5 - 5.5 g/dL Final  . Globulin, Total 04/20/2015 2.2  1.5 - 4.5 g/dL Final  . Albumin/Globulin Ratio 04/20/2015 1.9  1.1 - 2.5 Final  . Bilirubin Total 04/20/2015 0.5  0.0 - 1.2 mg/dL Final  . Alkaline Phosphatase 04/20/2015 66  39 - 117 IU/L Final  . AST 04/20/2015 14  0 - 40 IU/L Final  . ALT 04/20/2015 17  0 - 32 IU/L Final  . Cholesterol, Total 04/20/2015 151  100 - 199 mg/dL Final  . Triglycerides 04/20/2015 124  0 - 149 mg/dL Final  . HDL 14/78/2956 51  >39 mg/dL Final  . VLDL Cholesterol Cal 04/20/2015 25  5 - 40 mg/dL Final  . LDL Calculated 04/20/2015 75  0 - 99 mg/dL Final  . Chol/HDL Ratio 04/20/2015 3.0  0.0 - 4.4 ratio units Final   Comment:                                   T. Chol/HDL Ratio                                              Men  Women                               1/2 Avg.Risk  3.4    3.3  Avg.Risk  5.0    4.4                                2X Avg.Risk  9.6    7.1                                3X Avg.Risk 23.4   11.0   . TSH 04/20/2015 2.640  0.450 - 4.500 uIU/mL Final    No results found.   Assessment/Plan   ICD-9-CM ICD-10-CM   1. Fever, unspecified 780.60 R50.9 POC Influenza A/B  2. Acute upper respiratory infection 465.9 J06.9   3. Exposure to the flu V01.79 Z20.828 oseltamivir (TAMIFLU) 75 MG capsule  4.      Chronic LBP  Push fluids and rest  May take OTC cough remedies as needed  Gargle with warm salt water as needed for sore throat  Return to office if no better or go to the ER if symptoms worsen  Cont current meds as ordered  Follow up as scheduled. Work note given today  [employer]. Ancil Linsey  [facility] [address] [place], [place] [address] [phone] Cell (Monday-Friday 8 AM - 5 PM) [phone] After 5 PM and follow prompts

## 2015-07-06 NOTE — Patient Instructions (Signed)
Push fluids and rest  May take OTC cough remedies as needed  Gargle with warm salt water as needed for sore throat  Return to office if no better or go to the ER if symptoms worsen  Follow up as scheduled. Work note given today

## 2015-07-13 ENCOUNTER — Telehealth: Payer: Self-pay

## 2015-07-13 NOTE — Telephone Encounter (Signed)
[facility]. She needs to schedule her 6 mo f/u

## 2015-07-13 NOTE — Telephone Encounter (Signed)
Patient will be out of town on Friday and would like to know if her Oxycodone can be released tomorrow.   Last filled 06/15/15 (only 28 days in February) Last OV 07/06/15 No pending OV  Please advise

## 2015-07-14 MED ORDER — OXYCODONE HCL 15 MG PO TABS: 15.0000 mg | ORAL_TABLET | ORAL | Status: DC | PRN

## 2015-07-14 NOTE — Telephone Encounter (Signed)
Discussed with patient

## 2015-07-15 ENCOUNTER — Other Ambulatory Visit: Payer: Self-pay | Admitting: *Deleted

## 2015-07-15 MED ORDER — OXYCODONE HCL 15 MG PO TABS: 15.0000 mg | ORAL_TABLET | ORAL | Status: DC | PRN

## 2015-07-15 NOTE — Telephone Encounter (Signed)
Rx printed a day early per Dr. Montez Moritaarter. Disregard this Rx.

## 2015-07-15 NOTE — Telephone Encounter (Signed)
Patient Requested and will pick up 

## 2015-08-10 ENCOUNTER — Other Ambulatory Visit: Payer: Self-pay | Admitting: *Deleted

## 2015-08-10 MED ORDER — OXYCODONE HCL 15 MG PO TABS: 15.0000 mg | ORAL_TABLET | ORAL | Status: DC | PRN

## 2015-09-05 ENCOUNTER — Telehealth: Payer: Self-pay | Admitting: *Deleted

## 2015-09-05 NOTE — Telephone Encounter (Signed)
No - she needs to get Rx from GYN. If she has not scheduled her annual female exam, she needs to do so.

## 2015-09-05 NOTE — Telephone Encounter (Signed)
Patient is calling requesting a refill on her Birth Control. Is this ok? Please Advise.

## 2015-09-06 ENCOUNTER — Other Ambulatory Visit: Payer: Self-pay | Admitting: *Deleted

## 2015-09-06 MED ORDER — OXYCODONE HCL 15 MG PO TABS: 15.0000 mg | ORAL_TABLET | ORAL | Status: DC | PRN

## 2015-09-06 NOTE — Telephone Encounter (Signed)
Patient is going out of town for mothers day tomorrow and needs her pain medication filled alittle early. Told her I would this 1 time.

## 2015-09-06 NOTE — Telephone Encounter (Signed)
LMOM to return call.

## 2015-09-06 NOTE — Telephone Encounter (Signed)
Patient will call her GYN office to have filled.

## 2015-10-03 ENCOUNTER — Telehealth: Payer: Self-pay | Admitting: *Deleted

## 2015-10-03 NOTE — Telephone Encounter (Signed)
Patient called requesting her Rx for Oxycodone to be written for today. 30 days from last refill on 09/06/2015 is 10/06/2015. Informed patient that we would write Rx for Thursday and call her once it was ready. She stated that is should be written for Wednesday,but I told her 30 days was Thursday and we would print on that day. She asked if she could pick it up early due to maybe going out of town this weekend. Patient picked up her Rx early last month also early. Informed patient it would be printed on Thursday and we would call her once ready to pick up. Patient hung up on me.

## 2015-10-06 ENCOUNTER — Other Ambulatory Visit: Payer: Self-pay

## 2015-10-06 MED ORDER — OXYCODONE HCL 15 MG PO TABS: 15.0000 mg | ORAL_TABLET | ORAL | Status: DC | PRN

## 2015-10-06 NOTE — Telephone Encounter (Signed)
Patient notified that prescription is ready for pick up  .

## 2015-11-04 ENCOUNTER — Other Ambulatory Visit: Payer: Self-pay

## 2015-11-04 MED ORDER — OXYCODONE HCL 15 MG PO TABS: 15.0000 mg | ORAL_TABLET | ORAL | Status: DC | PRN

## 2015-12-05 ENCOUNTER — Other Ambulatory Visit: Payer: Self-pay

## 2015-12-05 MED ORDER — OXYCODONE HCL 15 MG PO TABS: 15.0000 mg | ORAL_TABLET | ORAL | 0 refills | Status: DC | PRN

## 2015-12-05 NOTE — Telephone Encounter (Signed)
Spoke with patient and advised rx ready for pick-up and it will be at the front desk.  

## 2015-12-05 NOTE — Addendum Note (Signed)
Addended by: Maurice SmallBEATTY, Elasha Tess C on: 12/05/2015 09:48 AM   Modules accepted: Orders

## 2015-12-09 ENCOUNTER — Encounter: Payer: Self-pay | Admitting: Internal Medicine

## 2015-12-09 ENCOUNTER — Ambulatory Visit (INDEPENDENT_AMBULATORY_CARE_PROVIDER_SITE_OTHER): Payer: Medicaid Other | Admitting: Internal Medicine

## 2015-12-09 ENCOUNTER — Other Ambulatory Visit: Payer: Self-pay | Admitting: Internal Medicine

## 2015-12-09 VITALS — BP 110/70 | HR 69 | Temp 98.0°F | Ht 67.0 in | Wt 137.6 lb

## 2015-12-09 DIAGNOSIS — Z720 Tobacco use: Secondary | ICD-10-CM

## 2015-12-09 DIAGNOSIS — Z79899 Other long term (current) drug therapy: Secondary | ICD-10-CM | POA: Diagnosis not present

## 2015-12-09 DIAGNOSIS — M25552 Pain in left hip: Secondary | ICD-10-CM | POA: Diagnosis not present

## 2015-12-09 DIAGNOSIS — M545 Low back pain, unspecified: Secondary | ICD-10-CM

## 2015-12-09 DIAGNOSIS — G8929 Other chronic pain: Secondary | ICD-10-CM | POA: Diagnosis not present

## 2015-12-09 DIAGNOSIS — F410 Panic disorder [episodic paroxysmal anxiety] without agoraphobia: Secondary | ICD-10-CM

## 2015-12-09 DIAGNOSIS — Z3041 Encounter for surveillance of contraceptive pills: Secondary | ICD-10-CM | POA: Diagnosis not present

## 2015-12-09 LAB — HEPATIC FUNCTION PANEL
ALT: 6 U/L (ref 6–29)
AST: 10 U/L (ref 10–30)
Albumin: 3.9 g/dL (ref 3.6–5.1)
Alkaline Phosphatase: 55 U/L (ref 33–115)
Bilirubin, Direct: 0 mg/dL (ref <=0.2)
Indirect Bilirubin: 0.2 mg/dL (ref 0.2–1.2)
Total Bilirubin: 0.2 mg/dL (ref 0.2–1.2)
Total Protein: 5.7 g/dL — ABNORMAL LOW (ref 6.1–8.1)

## 2015-12-09 MED ORDER — ESCITALOPRAM OXALATE 20 MG PO TABS: 20.0000 mg | ORAL_TABLET | Freq: Every day | ORAL | 6 refills | Status: DC

## 2015-12-09 MED ORDER — NORETHIN-ETH ESTRADIOL-FE 0.4-35 MG-MCG PO CHEW: 1.0000 | CHEWABLE_TABLET | Freq: Every day | ORAL | 6 refills | Status: DC

## 2015-12-09 NOTE — Patient Instructions (Signed)
Continue current medications as ordered  Will call with lab results  Smoking cessation discussed and highly urged  Follow up in  6 mos for CPE
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
## 2015-12-14 LAB — DRUG ABUSE PANEL 10-50 NO CONF, U
AMPHETAMINES (1000 ng/mL SCRN): NEGATIVE
BARBITURATES: NEGATIVE
BENZODIAZEPINES: NEGATIVE
COCAINE METABOLITES: NEGATIVE
MARIJUANA MET (50 ng/mL SCRN): POSITIVE — AB
METHADONE: NEGATIVE
METHAQUALONE: NEGATIVE
OPIATES: POSITIVE — AB
PHENCYCLIDINE: NEGATIVE
PROPOXYPHENE: NEGATIVE

## 2016-03-07 ENCOUNTER — Ambulatory Visit: Payer: Medicaid Other | Admitting: Internal Medicine

## 2016-06-21 ENCOUNTER — Other Ambulatory Visit: Payer: Self-pay | Admitting: Internal Medicine

## 2016-06-21 DIAGNOSIS — Z3041 Encounter for surveillance of contraceptive pills: Secondary | ICD-10-CM

## 2016-06-22 ENCOUNTER — Other Ambulatory Visit: Payer: Self-pay | Admitting: Internal Medicine

## 2016-06-22 DIAGNOSIS — Z3041 Encounter for surveillance of contraceptive pills: Secondary | ICD-10-CM

## 2016-07-21 ENCOUNTER — Other Ambulatory Visit: Payer: Self-pay | Admitting: Internal Medicine

## 2016-07-21 DIAGNOSIS — Z3041 Encounter for surveillance of contraceptive pills: Secondary | ICD-10-CM

## 2016-08-22 ENCOUNTER — Other Ambulatory Visit: Payer: Self-pay | Admitting: Internal Medicine

## 2016-08-22 DIAGNOSIS — Z3041 Encounter for surveillance of contraceptive pills: Secondary | ICD-10-CM

## 2016-11-09 ENCOUNTER — Other Ambulatory Visit: Payer: Self-pay | Admitting: Internal Medicine

## 2016-11-09 DIAGNOSIS — Z3041 Encounter for surveillance of contraceptive pills: Secondary | ICD-10-CM

## 2016-12-06 DIAGNOSIS — F4312 Post-traumatic stress disorder, chronic: Secondary | ICD-10-CM | POA: Diagnosis not present

## 2016-12-28 ENCOUNTER — Telehealth: Payer: Self-pay

## 2016-12-28 MED ORDER — METHYLPREDNISOLONE 4 MG PO TBPK: ORAL_TABLET | ORAL | 0 refills | Status: DC

## 2016-12-28 NOTE — Telephone Encounter (Signed)
Spoke with patient about pregnancy warning that populated when I attempted to send rx.  Patient stated " I am not pregnant"  RX sent

## 2016-12-28 NOTE — Telephone Encounter (Signed)
Patient called complaining of cold/upper respiratory/sinus symptoms  1. Fever? No 2. Chills? No 3. Myalgias? No  4. How long have you had your symptoms? Since Monday 12/24/16, patient with nose bleed x 1, sinus pressure in face, and puffy eyes  5. Are you coughing up mucus and if yes any discoloration? No  6. What have you done for your symptoms thus far? Claritin and zyrtec   7. Have you been around anyone sick? No, works in nursing home  * No available appointments today, pharmacy on file verified  I will forward your response to your provider and call you with further instructions

## 2016-12-28 NOTE — Telephone Encounter (Signed)
Recommend medrol dosepak #1 take as directed. No RF

## 2016-12-29 ENCOUNTER — Emergency Department (HOSPITAL_BASED_OUTPATIENT_CLINIC_OR_DEPARTMENT_OTHER)
Admission: EM | Admit: 2016-12-29 | Discharge: 2016-12-29 | Disposition: A | Discharge status: Home / Self Care | Payer: BLUE CROSS/BLUE SHIELD | Attending: Emergency Medicine | Admitting: Emergency Medicine

## 2016-12-29 ENCOUNTER — Encounter (HOSPITAL_BASED_OUTPATIENT_CLINIC_OR_DEPARTMENT_OTHER): Payer: Self-pay | Admitting: Emergency Medicine

## 2016-12-29 ENCOUNTER — Emergency Department (HOSPITAL_BASED_OUTPATIENT_CLINIC_OR_DEPARTMENT_OTHER): Payer: BLUE CROSS/BLUE SHIELD

## 2016-12-29 DIAGNOSIS — R1030 Lower abdominal pain, unspecified: Secondary | ICD-10-CM | POA: Insufficient documentation

## 2016-12-29 DIAGNOSIS — R600 Localized edema: Secondary | ICD-10-CM | POA: Diagnosis not present

## 2016-12-29 DIAGNOSIS — Z79899 Other long term (current) drug therapy: Secondary | ICD-10-CM | POA: Diagnosis not present

## 2016-12-29 DIAGNOSIS — H579 Unspecified disorder of eye and adnexa: Secondary | ICD-10-CM

## 2016-12-29 DIAGNOSIS — T7840XA Allergy, unspecified, initial encounter: Secondary | ICD-10-CM

## 2016-12-29 DIAGNOSIS — F17210 Nicotine dependence, cigarettes, uncomplicated: Secondary | ICD-10-CM | POA: Diagnosis not present

## 2016-12-29 DIAGNOSIS — R350 Frequency of micturition: Secondary | ICD-10-CM | POA: Diagnosis not present

## 2016-12-29 DIAGNOSIS — H05223 Edema of bilateral orbit: Secondary | ICD-10-CM | POA: Diagnosis not present

## 2016-12-29 LAB — CBC WITH DIFFERENTIAL/PLATELET
Band Neutrophils: 7 %
Basophils Absolute: 0.2 10*3/uL — ABNORMAL HIGH (ref 0.0–0.1)
Basophils Relative: 2 %
Blasts: 0 %
Eosinophils Absolute: 0 10*3/uL (ref 0.0–0.7)
Eosinophils Relative: 0 %
HCT: 36.1 % (ref 36.0–46.0)
Hemoglobin: 11.8 g/dL — ABNORMAL LOW (ref 12.0–15.0)
Lymphocytes Relative: 50 %
Lymphs Abs: 4.8 10*3/uL — ABNORMAL HIGH (ref 0.7–4.0)
MCH: 29.5 pg (ref 26.0–34.0)
MCHC: 32.7 g/dL (ref 30.0–36.0)
MCV: 90.3 fL (ref 78.0–100.0)
Metamyelocytes Relative: 0 %
Monocytes Absolute: 0.7 10*3/uL (ref 0.1–1.0)
Monocytes Relative: 7 %
Myelocytes: 0 %
Neutro Abs: 3.9 10*3/uL (ref 1.7–7.7)
Neutrophils Relative %: 34 %
Platelets: 167 10*3/uL (ref 150–400)
Promyelocytes Absolute: 0 %
RBC: 4 MIL/uL (ref 3.87–5.11)
RDW: 12.9 % (ref 11.5–15.5)
WBC: 9.6 10*3/uL (ref 4.0–10.5)
nRBC: 0 /100 WBC

## 2016-12-29 LAB — BASIC METABOLIC PANEL
Anion gap: 5 (ref 5–15)
BUN: 10 mg/dL (ref 6–20)
CO2: 27 mmol/L (ref 22–32)
Calcium: 8 mg/dL — ABNORMAL LOW (ref 8.9–10.3)
Chloride: 106 mmol/L (ref 101–111)
Creatinine, Ser: 0.65 mg/dL (ref 0.44–1.00)
GFR calc Af Amer: >60 mL/min (ref >60)
GFR calc non Af Amer: >60 mL/min (ref >60)
Glucose, Bld: 117 mg/dL — ABNORMAL HIGH (ref 65–99)
Potassium: 3.9 mmol/L (ref 3.5–5.1)
Sodium: 138 mmol/L (ref 135–145)

## 2016-12-29 LAB — URINALYSIS, ROUTINE W REFLEX MICROSCOPIC
Bilirubin Urine: NEGATIVE
Glucose, UA: NEGATIVE mg/dL
Ketones, ur: NEGATIVE mg/dL
Leukocytes, UA: NEGATIVE
Nitrite: NEGATIVE
Protein, ur: NEGATIVE mg/dL
Specific Gravity, Urine: 1.025 (ref 1.005–1.030)
pH: 6 (ref 5.0–8.0)

## 2016-12-29 LAB — URINALYSIS, MICROSCOPIC (REFLEX)

## 2016-12-29 LAB — PREGNANCY, URINE: Preg Test, Ur: NEGATIVE

## 2016-12-29 MED ORDER — DIPHENHYDRAMINE HCL 25 MG PO CAPS
25.0000 mg | ORAL_CAPSULE | Freq: Once | ORAL | Status: AC
  Administered 2016-12-29: 25 mg via ORAL
  Filled 2016-12-29: qty 1

## 2016-12-29 MED ORDER — FAMOTIDINE 20 MG PO TABS
20.0000 mg | ORAL_TABLET | Freq: Once | ORAL | Status: AC
  Administered 2016-12-29: 20 mg via ORAL
  Filled 2016-12-29: qty 1

## 2016-12-29 MED ORDER — FAMOTIDINE 20 MG PO TABS: 20.0000 mg | ORAL_TABLET | Freq: Two times a day (BID) | ORAL | 0 refills | Status: DC

## 2016-12-29 MED ORDER — TETRACAINE HCL 0.5 % OP SOLN
1.0000 [drp] | Freq: Once | OPHTHALMIC | Status: AC
  Administered 2016-12-29: 1 [drp] via OPHTHALMIC
  Filled 2016-12-29: qty 4

## 2016-12-29 MED ORDER — DIPHENHYDRAMINE HCL 25 MG PO CAPS: 25.0000 mg | ORAL_CAPSULE | Freq: Four times a day (QID) | ORAL | 0 refills | Status: DC | PRN

## 2016-12-29 NOTE — ED Notes (Signed)
Pt wishes to have pregnancy test prior to CT scan; Nurse made aware.  CT will await results of preg test prior to scanning

## 2016-12-29 NOTE — ED Triage Notes (Signed)
Patient states that she has a nose bleed oon Monday and it has caused her bilateral eyes to swell up all week. She also thinks she may have a UTI since she has had urinary frequency since Sunday

## 2016-12-29 NOTE — ED Provider Notes (Signed)
[facility] Provider Note   CSN: [id number] Arrival date & time: 12/29/16  1604     History   Chief Complaint Chief Complaint  Patient presents with  . Facial Swelling    HPI [name] is a 36 y.o. female who presents with bilateral periorbital swelling and tenderness. Patient states this is been ongoing for the last 5 days. Patient reports that prior to onset of symptoms she did have a nosebleed that occurred. Patient states that the nosebleed resolved without any intervention and she has not had any since. Patient denies any trauma to the face. Patient states that she took Zyrtec with minimal improvement in symptoms. She called her primary care doctor yesterday who prescribed prednisone. Patient took one dose of prednisone and felt like symptoms worsened, ED visit. Patient denies any vision changes, eye irritation, eye pain, eye redness. Patient states that she has her glasses but does not wear any contacts. She denies any trauma to the eyes bilaterally. Patient also reporting that she has had some increased urinary frequency and some flank pain. Patient states that she has a history of kidney stones and states that symptoms feel similar to when she begins to have kidney stone. Patient denies any fevers, chest pain, difficulty breathing, sore throat, facial swelling, abdominal pain, hematuria, dysuria  The history is provided by the patient.    Past Medical History:  Diagnosis Date  . Lower back pain   . Renal disorder     Patient Active Problem List   Diagnosis Date Noted  . Panic disorder 06/15/2015  . Oral contraceptive use 06/15/2015  . Continuous tobacco abuse 06/15/2015  . Left leg numbness 06/15/2015  . Chronic left hip pain 06/15/2015  . Chronic low back pain 06/15/2015    Past Surgical History:  Procedure Laterality Date  . HIP SURGERY    . JOINT REPLACEMENT    . MANDIBLE FRACTURE SURGERY      OB History    No data available       Home  Medications    Prior to Admission medications   Medication Sig Start Date End Date Taking? Authorizing Provider  diphenhydrAMINE (BENADRYL) 25 mg capsule Take 1 capsule (25 mg total) by mouth every 6 (six) hours as needed. 12/29/16 01/03/17  Maxwell Caul, PA-C  escitalopram (LEXAPRO) 20 MG tablet Take 1 tablet (20 mg total) by mouth daily. 12/09/15   Kirt Boys, DO  famotidine (PEPCID) 20 MG tablet Take 1 tablet (20 mg total) by mouth 2 (two) times daily. 12/29/16 01/03/17  Maxwell Caul, PA-C  methocarbamol (ROBAXIN) 500 MG tablet Take 500 mg by mouth 2 (two) times daily.    [provider]  methylPREDNISolone (MEDROL DOSEPAK) 4 MG TBPK tablet Take daily as directed 12/28/16   Kirt Boys, DO  oxyCODONE (ROXICODONE) 15 MG immediate release tablet Take 1 tablet (15 mg total) by mouth every 4 (four) hours as needed for pain. 12/05/15   Reed, Tiffany L, DO  oxyCODONE (ROXICODONE) 15 MG immediate release tablet Take 1 tablet (15 mg total) by mouth every 4 (four) hours as needed for pain. 12/05/15   Reed, Tiffany L, DO  WYMZYA FE 0.4-35 MG-MCG tablet CHEW 1 TABLET BY MOUTH DAILY. TAKE 1 TABLET BY MOUTH DAILY 11/09/16   Kirt Boys, DO    Family History Family History  Problem Relation Age of Onset  . Heart attack Father   . Stroke Father     Social History Social History  Substance  Use Topics  . Smoking status: Current Every Day Smoker    Packs/day: 1.00    Types: Cigarettes  . Smokeless tobacco: Never Used  . Alcohol use No     Allergies   Hydrocodone-acetaminophen and Zithromax [azithromycin]   Review of Systems Review of Systems  Constitutional: Negative for fever.  HENT: Positive for facial swelling (Periorbital swelling).   Respiratory: Negative for cough and shortness of breath.   Cardiovascular: Negative for chest pain.  Gastrointestinal: Negative for abdominal pain, nausea and vomiting.  Genitourinary: Positive for flank pain and frequency. Negative for  dysuria and hematuria.  Neurological: Negative for headaches.     Physical Exam Updated Vital Signs BP 112/78 (BP Location: Right Arm)   Pulse 68   Temp 98.2 F (36.8 C) (Oral)   Resp 16   Ht 5\' 7"  (1.702 m)   Wt 59 kg (130 lb)   LMP 12/08/2016   SpO2 100%   BMI 20.36 kg/m   Physical Exam  Constitutional: She is oriented to person, place, and time. She appears well-developed and well-nourished.  Appears anxious but in no acute distress  HENT:  Head: Normocephalic and atraumatic.  Mouth/Throat: Oropharynx is clear and moist and mucous membranes are normal.  Eyes: Pupils are equal, round, and reactive to light. Conjunctivae, EOM and lids are normal. Right conjunctiva is not injected. Left conjunctiva is not injected.  Mild edema to the upper eyelids bilaterally. Patient is able to still open and close her eyelids without any difficulty. Mild edema to the lower periorbital region bilaterally. No overlying warmth, erythema, tenderness. No evidence of ecchymosis. EOM's intact fully without any difficulty. Bilateral eyes without any conjunctival hemorrhage, hyphema, hypopyon.  Neck: Full passive range of motion without pain.  Cardiovascular: Normal rate, regular rhythm, normal heart sounds and normal pulses.  Exam reveals no gallop and no friction rub.   No murmur heard. Pulmonary/Chest: Effort normal and breath sounds normal.  Abdominal: Soft. Normal appearance. There is no tenderness. There is CVA tenderness. There is no rigidity, no guarding, no tenderness at McBurney's point and negative Murphy's sign.  Right CVA tenderness. Abdomen is soft, nondistended, nontender. Patient does have some mild right CVA tenderness. No McBurney's point tenderness. Negative Murphy sign.  Musculoskeletal: Normal range of motion.  Neurological: She is alert and oriented to person, place, and time.  Skin: Skin is warm and dry. Capillary refill takes less than 2 seconds.  Psychiatric: She has a normal  mood and affect. Her speech is normal.  Nursing note and vitals reviewed.    ED Treatments / Results  Labs (all labs ordered are listed, but only abnormal results are displayed) Labs Reviewed  URINALYSIS, ROUTINE W REFLEX MICROSCOPIC - Abnormal; Notable for the following:       Result Value   APPearance HAZY (*)    Hgb urine dipstick LARGE (*)    All other components within normal limits  URINALYSIS, MICROSCOPIC (REFLEX) - Abnormal; Notable for the following:    Bacteria, UA FEW (*)    Squamous Epithelial / LPF 6-30 (*)    All other components within normal limits  BASIC METABOLIC PANEL - Abnormal; Notable for the following:    Glucose, Bld 117 (*)    Calcium 8.0 (*)    All other components within normal limits  CBC WITH DIFFERENTIAL/PLATELET - Abnormal; Notable for the following:    Hemoglobin 11.8 (*)    Lymphs Abs 4.8 (*)    Basophils Absolute 0.2 (*)  All other components within normal limits  PREGNANCY, URINE    EKG  EKG Interpretation None       Radiology Ct Renal Stone Study  Result Date: 12/29/2016 CLINICAL DATA:  Bilateral back pain for the past 6 days. Urinary frequency. EXAM: CT ABDOMEN AND PELVIS WITHOUT CONTRAST TECHNIQUE: Multidetector CT imaging of the abdomen and pelvis was performed following the standard protocol without IV contrast. COMPARISON:  12/28/2010. FINDINGS: Lower chest: Mild bilateral dependent atelectasis and small bilateral pleural effusions. Hepatobiliary: No focal liver abnormality is seen. No gallstones, gallbladder wall thickening, or biliary dilatation. Pancreas: Unremarkable. No pancreatic ductal dilatation or surrounding inflammatory changes. Spleen: Normal in size without focal abnormality. Adrenals/Urinary Tract: Adrenal glands are unremarkable. Kidneys are normal, without renal calculi, focal lesion, or hydronephrosis. Bladder is unremarkable. Stomach/Bowel: Unremarkable stomach, small bowel and colon. No evidence of appendicitis.  Vascular/Lymphatic: No significant vascular findings are present. No enlarged abdominal or pelvic lymph nodes. Reproductive: Uterus and bilateral adnexa are unremarkable. Other: Small amount of free peritoneal fluid in the pelvic cul-de-sac, within normal limits of physiological fluid. Musculoskeletal: Left acetabular fixation hardware. IMPRESSION: 1. Small bilateral pleural effusions. 2. Mild bilateral dependent atelectasis. 3. No acute abdominal or pelvic abnormality. Electronically Signed   By: Beckie SaltsSteven  Reid M.D.   On: 12/29/2016 17:53    Procedures Procedures (including critical care time)  Medications Ordered in ED Medications  tetracaine (PONTOCAINE) 0.5 % ophthalmic solution 1 drop (1 drop Both Eyes Given 12/29/16 1718)  diphenhydrAMINE (BENADRYL) capsule 25 mg (25 mg Oral Given 12/29/16 1721)  famotidine (PEPCID) tablet 20 mg (20 mg Oral Given 12/29/16 1721)     Initial Impression / Assessment and Plan / ED Course  I have reviewed the triage vital signs and the nursing notes.  Pertinent labs & imaging results that were available during my care of the patient were reviewed by me and considered in my medical decision making (see chart for details).     36 year old female who presents with bilateral periorbital edema that has been ongoing for the last 5 days. No surrounding warmth, erythema, tenderness. No vision changes. No eye complaints. No fevers/chills. Also complaining of urinary frequency and flank pain. Patient is afebrile, non-toxic appearing, sitting comfortably on examination table. Vital signs reviewed and stable. Physical exam shows some mild edema to the eyelids and lower periorbital regions bilaterally. With no overlying warmth, erythema or tenderness. EOMs intact without difficulty. Eyes with no conjunctival injection, hyphema, hypopyon. Physical exam also some mild bilateral CVA tenderness, right greater than left. No abdominal tenderness. Urine ordered at triage. History/physical  exam are not concerning for preseptal or orbital cellulitis. Do not suspect facial fracture or dislocation given the lack of history of trauma and Gen. appearance on physical exam. Urine ordered at triage. Negative for any acute signs of infection. Does show moderate hemoglobin. Patient is on her menstrual cycle currently. Plan to check eye peas and visual acuity. Will also plan to check basic labs and a CBC and BMP for evaluation of kidney function. I discussed at length regarding the need for a CT renal stone study. Patient reports that she did not, primarily for the urinary complaints but fair that while she was here should get it checked out. I explained that given the lack of symptoms that a CT scan would likely not be needed but patient was adamant that she would like it checked out and evaluated. Will plan to get a CT renal stone study for further evaluation.   Labs  and imaging reviewed. Urine pregnancy negative. BMP shows slight hyperglycemia but otherwise unremarkable. CBC unremarkable. CT scan shows negative for kidney stone. Discussed results with patient.  Right IOP: 15 and 11 Left IOP: 11 and 16  Visual acuity as document above. Do not suspect preseptal or orbital cellulitis at this time. Symptoms likely result of allergies. Will plan to have patient take Benadryl Pepcid for the next few days to help with symptomatically. Instructed patient to apply cold compresses. Instructed patient to follow-up with her primary care doctor next 24-48 hours for further evaluation. Strict return precautions discussed. Patient and husband expresses understanding and agreement to plan.     Visual Acuity  Right Eye Distance: 20/20 Left Eye Distance: 20/25 Bilateral Distance: 20/25    Final Clinical Impressions(s) / ED Diagnoses   Final diagnoses:  Bilateral eye complaint  Allergic reaction, initial encounter    New Prescriptions Discharge Medication List as of 12/29/2016  6:31 PM    START taking  these medications   Details  diphenhydrAMINE (BENADRYL) 25 mg capsule Take 1 capsule (25 mg total) by mouth every 6 (six) hours as needed., Starting Sat 12/29/2016, Until Thu 01/03/2017, Print    famotidine (PEPCID) 20 MG tablet Take 1 tablet (20 mg total) by mouth 2 (two) times daily., Starting Sat 12/29/2016, Until Thu 01/03/2017, Print         Maxwell Caul, PA-C 12/30/16 [id number]    Gwyneth Sprout, MD 12/30/16 1425

## 2016-12-29 NOTE — Discharge Instructions (Signed)
Take Benadryl and Pepcid as directed.  Apply cold compresses to the area to help with swelling.  Follow-up with her primary care doctor next 4 days for further evaluation. Follow up with your eye doctor in next 2-4 days for further evaluation.  Return the emergency Department for any worsening swelling, redness, warmth, fever, vision changes or any other worsening or concerning symptoms.

## 2016-12-29 NOTE — ED Notes (Signed)
Pt to CT at this time.

## 2016-12-29 NOTE — ED Notes (Signed)
PA at bedside discussing results with patient 

## 2017-01-08 ENCOUNTER — Ambulatory Visit (INDEPENDENT_AMBULATORY_CARE_PROVIDER_SITE_OTHER): Payer: BLUE CROSS/BLUE SHIELD | Admitting: Internal Medicine

## 2017-01-08 VITALS — BP 104/62 | HR 97 | Temp 98.0°F | Ht 67.0 in | Wt 128.0 lb

## 2017-01-08 DIAGNOSIS — M5442 Lumbago with sciatica, left side: Secondary | ICD-10-CM

## 2017-01-08 DIAGNOSIS — Z0000 Encounter for general adult medical examination without abnormal findings: Secondary | ICD-10-CM

## 2017-01-08 DIAGNOSIS — G8929 Other chronic pain: Secondary | ICD-10-CM | POA: Diagnosis not present

## 2017-01-08 DIAGNOSIS — Z23 Encounter for immunization: Secondary | ICD-10-CM | POA: Diagnosis not present

## 2017-01-08 DIAGNOSIS — J301 Allergic rhinitis due to pollen: Secondary | ICD-10-CM

## 2017-01-08 DIAGNOSIS — M25552 Pain in left hip: Secondary | ICD-10-CM | POA: Diagnosis not present

## 2017-01-08 DIAGNOSIS — Z79899 Other long term (current) drug therapy: Secondary | ICD-10-CM | POA: Diagnosis not present

## 2017-01-08 DIAGNOSIS — F410 Panic disorder [episodic paroxysmal anxiety] without agoraphobia: Secondary | ICD-10-CM | POA: Diagnosis not present

## 2017-01-08 DIAGNOSIS — Z3041 Encounter for surveillance of contraceptive pills: Secondary | ICD-10-CM

## 2017-01-08 DIAGNOSIS — F1190 Opioid use, unspecified, uncomplicated: Secondary | ICD-10-CM | POA: Diagnosis not present

## 2017-01-08 DIAGNOSIS — Z720 Tobacco use: Secondary | ICD-10-CM | POA: Diagnosis not present

## 2017-01-08 MED ORDER — NORETHIN-ETH ESTRADIOL-FE 0.4-35 MG-MCG PO CHEW: 1.0000 | CHEWABLE_TABLET | Freq: Every day | ORAL | 6 refills | Status: DC

## 2017-01-08 MED ORDER — OXYCODONE HCL 15 MG PO TABS: 15.0000 mg | ORAL_TABLET | ORAL | 0 refills | Status: DC | PRN

## 2017-01-08 MED ORDER — ESCITALOPRAM OXALATE 20 MG PO TABS: 20.0000 mg | ORAL_TABLET | Freq: Every day | ORAL | 6 refills | Status: DC

## 2017-01-08 NOTE — Patient Instructions (Addendum)
Take roxicodone sparingly  Return to office for fasting labs in next 1-2 weeks  Continue other medications as ordered  Follow up with mental health as scheduled  Tdap vaccine given today  Needs pain contract  Follow up in 3 mos for chronic back/hip pain
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
## 2017-02-04 ENCOUNTER — Other Ambulatory Visit: Payer: Self-pay

## 2017-02-04 DIAGNOSIS — Z3041 Encounter for surveillance of contraceptive pills: Secondary | ICD-10-CM

## 2017-02-04 MED ORDER — NORETHIN-ETH ESTRADIOL-FE 0.4-35 MG-MCG PO CHEW: 1.0000 | CHEWABLE_TABLET | Freq: Every day | ORAL | 6 refills | Status: DC

## 2017-02-04 NOTE — Telephone Encounter (Signed)
Patient called to request a refill on birth control. Rx was sent to pharmacy.

## 2017-02-07 ENCOUNTER — Other Ambulatory Visit: Payer: Self-pay | Admitting: *Deleted

## 2017-02-07 DIAGNOSIS — M5442 Lumbago with sciatica, left side: Principal | ICD-10-CM

## 2017-02-07 DIAGNOSIS — M25552 Pain in left hip: Secondary | ICD-10-CM

## 2017-02-07 DIAGNOSIS — G8929 Other chronic pain: Secondary | ICD-10-CM

## 2017-02-07 MED ORDER — OXYCODONE HCL 15 MG PO TABS: 15.0000 mg | ORAL_TABLET | ORAL | 0 refills | Status: DC | PRN

## 2017-02-07 NOTE — Telephone Encounter (Signed)
Patient requested and will pick up NCCSRS Database Verified. Dr. Montez Morita decreased Quantity at last visit and told patient to use sparingly

## 2017-03-07 ENCOUNTER — Telehealth: Payer: Self-pay | Admitting: *Deleted

## 2017-03-07 NOTE — Telephone Encounter (Signed)
Answer is no - not without labs 1st

## 2017-03-07 NOTE — Telephone Encounter (Signed)
Patient called requesting refill on her Oxycodone 15mg . Last Refill given was 02/07/17 #30. NCCSRS Database Verified.  Reviewed last OV note and patient has not had her fasting labs as to date. Was to return in 1-2 weeks from 01/08/17.   Please Advise.

## 2017-03-07 NOTE — Telephone Encounter (Signed)
Denied - she needs to complete labs 1st

## 2017-03-07 NOTE — Telephone Encounter (Signed)
Called and notified patient. Patient stated that she is currently in the process of getting insurance and unable to get labs done at this time. Stated that she lost her job and the insurance was terminated and she is now in the process of getting some more. Stated that she made a 3 month follow up appointment and wants to know if she can get her bloodwork done at that time instead. Wants to still get her Rx. Please Advise.

## 2017-03-08 NOTE — Telephone Encounter (Signed)
Patient notified and stated that she will call back to schedule an appointment.

## 2017-04-10 ENCOUNTER — Ambulatory Visit: Payer: Self-pay | Admitting: Internal Medicine

## 2017-05-14 ENCOUNTER — Ambulatory Visit: Payer: Self-pay | Admitting: Internal Medicine

## 2017-07-08 DIAGNOSIS — F4312 Post-traumatic stress disorder, chronic: Secondary | ICD-10-CM | POA: Diagnosis not present

## 2017-09-09 ENCOUNTER — Other Ambulatory Visit: Payer: Self-pay | Admitting: Internal Medicine

## 2017-09-09 DIAGNOSIS — Z3041 Encounter for surveillance of contraceptive pills: Secondary | ICD-10-CM

## 2017-09-10 NOTE — Telephone Encounter (Signed)
A medication refill was received from pharmacy for norethin-eth estradiol-Fe. Rx was pended to provider for approval due to patient having not been seen since 12/2016. Patient is not scheduled for any upcoming appointments.

## 2017-09-27 ENCOUNTER — Other Ambulatory Visit: Payer: Self-pay | Admitting: Internal Medicine

## 2017-09-27 DIAGNOSIS — Z3041 Encounter for surveillance of contraceptive pills: Secondary | ICD-10-CM

## 2017-09-27 NOTE — Telephone Encounter (Signed)
Patient hasn't been seen since 12/2016. No lab or office visits have been scheduled. Is it ok to fill the Norethin-Eth Estradiol-Fe?

## 2017-09-27 NOTE — Telephone Encounter (Signed)
Called to notify patient to schedule apnt. before any additional refills. She is working on finding an insurance to cover her and just may pay out of pocket to be seen.

## 2017-12-18 ENCOUNTER — Encounter: Payer: Self-pay | Admitting: Internal Medicine

## 2018-01-14 DIAGNOSIS — F4312 Post-traumatic stress disorder, chronic: Secondary | ICD-10-CM | POA: Diagnosis not present

## 2018-01-16 ENCOUNTER — Ambulatory Visit: Payer: Self-pay | Admitting: Internal Medicine

## 2018-01-17 ENCOUNTER — Encounter: Payer: Self-pay | Admitting: Internal Medicine

## 2018-01-17 ENCOUNTER — Ambulatory Visit (INDEPENDENT_AMBULATORY_CARE_PROVIDER_SITE_OTHER): Payer: 59 | Admitting: Internal Medicine

## 2018-01-17 VITALS — BP 112/70 | HR 59 | Temp 98.2°F | Ht 67.0 in | Wt 129.0 lb

## 2018-01-17 DIAGNOSIS — M5442 Lumbago with sciatica, left side: Secondary | ICD-10-CM | POA: Diagnosis not present

## 2018-01-17 DIAGNOSIS — J301 Allergic rhinitis due to pollen: Secondary | ICD-10-CM

## 2018-01-17 DIAGNOSIS — M25552 Pain in left hip: Secondary | ICD-10-CM | POA: Diagnosis not present

## 2018-01-17 DIAGNOSIS — M25572 Pain in left ankle and joints of left foot: Secondary | ICD-10-CM

## 2018-01-17 DIAGNOSIS — Z79899 Other long term (current) drug therapy: Secondary | ICD-10-CM

## 2018-01-17 DIAGNOSIS — N912 Amenorrhea, unspecified: Secondary | ICD-10-CM

## 2018-01-17 DIAGNOSIS — G8929 Other chronic pain: Secondary | ICD-10-CM

## 2018-01-17 DIAGNOSIS — F410 Panic disorder [episodic paroxysmal anxiety] without agoraphobia: Secondary | ICD-10-CM
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
## 2018-01-17 NOTE — Patient Instructions (Addendum)
Continue current medications as ordered - will send refills once labs resulted  Follow up with specialists as scheduled  Will call with podiatry referral   PLEASE SCHEDULE PAP SMEAR WITH JESSICA  Smoking cessation discussed and highly urged  Wear ankle brace on left until seen by podiatry  Follow up in 3 mos with Shanda BumpsJessica for panic d/o, chronic pain.

## 2018-01-18 LAB — COMPLETE METABOLIC PANEL WITH GFR
AG Ratio: 2 (calc) (ref 1.0–2.5)
ALT: 8 U/L (ref 6–29)
AST: 12 U/L (ref 10–30)
Albumin: 4.3 g/dL (ref 3.6–5.1)
Alkaline phosphatase (APISO): 48 U/L (ref 33–115)
BUN: 12 mg/dL (ref 7–25)
CO2: 31 mmol/L (ref 20–32)
Calcium: 8.7 mg/dL (ref 8.6–10.2)
Chloride: 103 mmol/L (ref 98–110)
Creat: 0.75 mg/dL (ref 0.50–1.10)
GFR, Est African American: 120 mL/min/{1.73_m2} (ref >=60)
GFR, Est Non African American: 103 mL/min/{1.73_m2} (ref >=60)
Globulin: 2.1 g/dL (calc) (ref 1.9–3.7)
Glucose, Bld: 79 mg/dL (ref 65–99)
Potassium: 3.8 mmol/L (ref 3.5–5.3)
Sodium: 137 mmol/L (ref 135–146)
Total Bilirubin: 0.4 mg/dL (ref 0.2–1.2)
Total Protein: 6.4 g/dL (ref 6.1–8.1)

## 2018-01-18 LAB — DRUG ABUSE PANEL 10-50 NO CONF, U
AMPHETAMINES (1000 ng/mL SCRN): NEGATIVE
BARBITURATES: NEGATIVE
BENZODIAZEPINES: NEGATIVE
COCAINE METABOLITES: NEGATIVE
MARIJUANA MET (50 ng/mL SCRN): NEGATIVE
METHADONE: NEGATIVE
METHAQUALONE: NEGATIVE
OPIATES: NEGATIVE
PHENCYCLIDINE: NEGATIVE
PROPOXYPHENE: NEGATIVE

## 2018-01-18 LAB — CBC WITH DIFFERENTIAL/PLATELET
Basophils Absolute: 52 cells/uL (ref 0–200)
Basophils Relative: 0.7 %
Eosinophils Absolute: 200 cells/uL (ref 15–500)
Eosinophils Relative: 2.7 %
HCT: 34.8 % — ABNORMAL LOW (ref 35.0–45.0)
Hemoglobin: 11.8 g/dL (ref 11.7–15.5)
Lymphs Abs: 2287 cells/uL (ref 850–3900)
MCH: 29.9 pg (ref 27.0–33.0)
MCHC: 33.9 g/dL (ref 32.0–36.0)
MCV: 88.3 fL (ref 80.0–100.0)
MPV: 9.6 fL (ref 7.5–12.5)
Monocytes Relative: 5.4 %
Neutro Abs: 4462 cells/uL (ref 1500–7800)
Neutrophils Relative %: 60.3 %
Platelets: 226 10*3/uL (ref 140–400)
RBC: 3.94 10*6/uL (ref 3.80–5.10)
RDW: 12.2 % (ref 11.0–15.0)
Total Lymphocyte: 30.9 %
WBC mixed population: 400 cells/uL (ref 200–950)
WBC: 7.4 10*3/uL (ref 3.8–10.8)

## 2018-01-18 LAB — TSH: TSH: 7.11 mIU/L — ABNORMAL HIGH

## 2018-01-18 LAB — HCG, SERUM, QUALITATIVE: Preg, Serum: NEGATIVE

## 2018-01-19 ENCOUNTER — Encounter: Payer: Self-pay | Admitting: Internal Medicine

## 2018-01-19 ENCOUNTER — Other Ambulatory Visit: Payer: Self-pay | Admitting: Internal Medicine

## 2018-01-19 DIAGNOSIS — M25552 Pain in left hip: Secondary | ICD-10-CM

## 2018-01-19 DIAGNOSIS — F410 Panic disorder [episodic paroxysmal anxiety] without agoraphobia: Secondary | ICD-10-CM

## 2018-01-19 DIAGNOSIS — M5442 Lumbago with sciatica, left side: Secondary | ICD-10-CM

## 2018-01-19 DIAGNOSIS — Z3041 Encounter for surveillance of contraceptive pills: Secondary | ICD-10-CM

## 2018-01-19 DIAGNOSIS — G8929 Other chronic pain: Secondary | ICD-10-CM

## 2018-01-19 MED ORDER — OXYCODONE HCL 15 MG PO TABS: 15.0000 mg | ORAL_TABLET | ORAL | 0 refills | Status: DC | PRN

## 2018-01-19 MED ORDER — ESCITALOPRAM OXALATE 20 MG PO TABS: 20.0000 mg | ORAL_TABLET | Freq: Every day | ORAL | 6 refills | Status: DC

## 2018-01-19 MED ORDER — NORETHIN-ETH ESTRADIOL-FE 0.4-35 MG-MCG PO CHEW: 1.0000 | CHEWABLE_TABLET | Freq: Every day | ORAL | 3 refills | Status: DC

## 2018-02-04 ENCOUNTER — Telehealth: Payer: Self-pay

## 2018-02-04 ENCOUNTER — Other Ambulatory Visit: Payer: Self-pay | Admitting: Internal Medicine

## 2018-02-04 ENCOUNTER — Telehealth: Payer: Self-pay | Admitting: *Deleted

## 2018-02-04 DIAGNOSIS — Z3041 Encounter for surveillance of contraceptive pills: Secondary | ICD-10-CM

## 2018-02-04 DIAGNOSIS — M5442 Lumbago with sciatica, left side: Principal | ICD-10-CM

## 2018-02-04 DIAGNOSIS — G8929 Other chronic pain: Secondary | ICD-10-CM

## 2018-02-04 DIAGNOSIS — M25552 Pain in left hip: Secondary | ICD-10-CM

## 2018-02-04 MED ORDER — NORETHIN-ETH ESTRADIOL-FE 0.4-35 MG-MCG PO CHEW: CHEWABLE_TABLET | ORAL | 0 refills | Status: DC

## 2018-02-04 MED ORDER — OXYCODONE HCL 15 MG PO TABS: 15.0000 mg | ORAL_TABLET | ORAL | 0 refills | Status: DC | PRN

## 2018-02-04 NOTE — Telephone Encounter (Signed)
Rx faxed to pharmacy. Patient notified and agreed.  

## 2018-02-04 NOTE — Telephone Encounter (Signed)
Late Entry, call came in after 4:00 pm. Patient called to inform Dr.Carter that she has a pending appointment Thursday with [facility] to f/u on ankle pain.  Patient needs new rx for Oxycodone. Patient would like to confirm dose and instructions for she was only given enough for 15 days when she usually gets a 30 day supply.  Patient states she was initially taking Oxycodone for back and hip pain. Patient now with ankle pain as well.  Pharmacy confirmed as [facility]   Please advise

## 2018-02-04 NOTE — Telephone Encounter (Signed)
Just pulled message from Clinical Intake line. Patient is requesting refill on Birth Control. Stated that she has an appointment with Shanda Bumps for a Pap on 02/24/18. Also stated that she needs a PA for her pain medication. I will initiate through CoverMyMeds. Please Advise.

## 2018-02-04 NOTE — Telephone Encounter (Signed)
Tell pt that she should only be taking the roxicodone for SEVERE pain and not just for mild-moderate pain level. She was given 90 tabs which should have been enough for her pain in general. Will send Rx today but she needs to be aware not to overuse medication.

## 2018-02-04 NOTE — Telephone Encounter (Signed)
Spoke with patient, patient aware of Dr.Carter's response and verbalized understanding.  [facility] and was told that she needs a PA for Oxycodone. [facility] informed [facility] to initiate via fax. I expalined the PA process to the patient.   Patient states she had left a message on voicemail for Synetta Fail this morning asking if Dr.Carter will allow 1 more refill on [facility]. Patient has pending CPX with Abbey Chatters, NP  Please advise   S.Chrae B/CMA

## 2018-02-04 NOTE — Telephone Encounter (Signed)
Ok for RF birth control x 1 month

## 2018-02-04 NOTE — Telephone Encounter (Signed)
Initiated Prior Authorization through [employer] for Oxycodone 15mg .  APPROVED PA Case ID: [id number]  Had to call patient because prior authorization Key the pharmacy sent kept going to [facility].   Patient stated that she did not have Medicaid. Stated that she now has [place] FA:[id number] ONG:[id number] XLK:[id number]  Pharmacy notified. Pharmacy also has Medicaid on file.

## 2018-02-05 ENCOUNTER — Other Ambulatory Visit: Payer: Self-pay | Admitting: Internal Medicine

## 2018-02-05 DIAGNOSIS — Z3041 Encounter for surveillance of contraceptive pills: Secondary | ICD-10-CM

## 2018-02-05 NOTE — Telephone Encounter (Signed)
E prescribe failed. Confirmed. Called in.

## 2018-02-06 ENCOUNTER — Ambulatory Visit (INDEPENDENT_AMBULATORY_CARE_PROVIDER_SITE_OTHER): Payer: 59 | Admitting: Podiatry

## 2018-02-06 ENCOUNTER — Other Ambulatory Visit: Payer: Self-pay

## 2018-02-06 ENCOUNTER — Encounter: Payer: Self-pay | Admitting: Podiatry

## 2018-02-06 DIAGNOSIS — S99912A Unspecified injury of left ankle, initial encounter: Secondary | ICD-10-CM

## 2018-02-06 DIAGNOSIS — R7989 Other specified abnormal findings of blood chemistry: Secondary | ICD-10-CM

## 2018-02-06 DIAGNOSIS — M659 Synovitis and tenosynovitis, unspecified: Secondary | ICD-10-CM

## 2018-02-06 DIAGNOSIS — M21961 Unspecified acquired deformity of right lower leg: Secondary | ICD-10-CM | POA: Diagnosis not present
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
## 2018-02-10 ENCOUNTER — Other Ambulatory Visit: Payer: 59

## 2018-02-10 DIAGNOSIS — R7989 Other specified abnormal findings of blood chemistry: Secondary | ICD-10-CM

## 2018-02-11 ENCOUNTER — Ambulatory Visit: Payer: 59 | Admitting: Nurse Practitioner

## 2018-02-11 LAB — TEST AUTHORIZATION

## 2018-02-11 LAB — TSH: TSH: 5.73 mIU/L — ABNORMAL HIGH

## 2018-02-11 LAB — T4: T4, Total: 7.2 ug/dL (ref 5.1–11.9)

## 2018-02-11 LAB — T4, FREE: Free T4: 0.8 ng/dL (ref 0.8–1.8)

## 2018-02-12 ENCOUNTER — Ambulatory Visit: Payer: Self-pay | Admitting: Nurse Practitioner

## 2018-02-24 ENCOUNTER — Encounter: Payer: Self-pay | Admitting: *Deleted

## 2018-02-24 ENCOUNTER — Ambulatory Visit (INDEPENDENT_AMBULATORY_CARE_PROVIDER_SITE_OTHER): Payer: 59 | Admitting: Nurse Practitioner

## 2018-02-24 ENCOUNTER — Other Ambulatory Visit: Payer: Self-pay | Admitting: Psychiatry

## 2018-02-24 ENCOUNTER — Encounter: Payer: Self-pay | Admitting: Nurse Practitioner

## 2018-02-24 VITALS — BP 116/74 | HR 62 | Temp 98.7°F | Ht 67.0 in | Wt 129.3 lb

## 2018-02-24 DIAGNOSIS — R7989 Other specified abnormal findings of blood chemistry: Secondary | ICD-10-CM

## 2018-02-24 DIAGNOSIS — F410 Panic disorder [episodic paroxysmal anxiety] without agoraphobia: Secondary | ICD-10-CM | POA: Diagnosis not present

## 2018-02-24 DIAGNOSIS — Z0000 Encounter for general adult medical examination without abnormal findings: Secondary | ICD-10-CM | POA: Diagnosis not present

## 2018-02-24 DIAGNOSIS — Z23 Encounter for immunization: Secondary | ICD-10-CM

## 2018-02-24 DIAGNOSIS — Z3041 Encounter for surveillance of contraceptive pills: Secondary | ICD-10-CM | POA: Diagnosis not present

## 2018-02-24 DIAGNOSIS — Z124 Encounter for screening for malignant neoplasm of cervix: Secondary | ICD-10-CM | POA: Diagnosis not present

## 2018-02-24 MED ORDER — NORETHIN-ETH ESTRADIOL-FE 0.4-35 MG-MCG PO CHEW: CHEWABLE_TABLET | ORAL | 1 refills | Status: DC

## 2018-02-24 MED ORDER — ESCITALOPRAM OXALATE 20 MG PO TABS: 20.0000 mg | ORAL_TABLET | Freq: Every day | ORAL | 6 refills | Status: DC
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
## 2018-02-24 NOTE — Patient Instructions (Addendum)
Follow up in 3 months for [place] Maintenance, Female Adopting a healthy lifestyle and getting preventive care can go a long way to promote health and wellness. Talk with your health care provider about what schedule of regular examinations is right for you. This is a good chance for you to check in with your provider about disease prevention and staying healthy. In between checkups, there are plenty of things you can do on your own. Experts have done a lot of research about which lifestyle changes and preventive measures are most likely to keep you healthy. Ask your health care provider for more information. Weight and diet Eat a healthy diet  Be sure to include plenty of vegetables, fruits, low-fat dairy products, and lean protein.  Do not eat a lot of foods high in solid fats, added sugars, or salt.  Get regular exercise. This is one of the most important things you can do for your health. ? Most adults should exercise for at least 150 minutes each week. The exercise should increase your heart rate and make you sweat (moderate-intensity exercise). ? Most adults should also do strengthening exercises at least twice a week. This is in addition to the moderate-intensity exercise.  Maintain a healthy weight  Body mass index (BMI) is a measurement that can be used to identify possible weight problems. It estimates body fat based on height and weight. Your health care provider can help determine your BMI and help you achieve or maintain a healthy weight.  For females 47 years of age and older: ? A BMI below 18.5 is considered underweight. ? A BMI of 18.5 to 24.9 is normal. ? A BMI of 25 to 29.9 is considered overweight. ? A BMI of 30 and above is considered obese.  Watch levels of cholesterol and blood lipids  You should start having your blood tested for lipids and cholesterol at 35 years of age, then have this test every 5 years.  You may need to have your cholesterol  levels checked more often if: ? Your lipid or cholesterol levels are high. ? You are older than 35 years of age. ? You are at high risk for heart disease.  Cancer screening Lung Cancer  Lung cancer screening is recommended for adults 68-63 years old who are at high risk for lung cancer because of a history of smoking.  A yearly low-dose CT scan of the lungs is recommended for people who: ? Currently smoke. ? Have quit within the past 15 years. ? Have at least a 30-pack-year history of smoking. A pack year is smoking an average of one pack of cigarettes a day for 1 year.  Yearly screening should continue until it has been 15 years since you quit.  Yearly screening should stop if you develop a health problem that would prevent you from having lung cancer treatment.  Breast Cancer  Practice breast self-awareness. This means understanding how your breasts normally appear and feel.  It also means doing regular breast self-exams. Let your health care provider know about any changes, no matter how small.  If you are in your 20s or 30s, you should have a clinical breast exam (CBE) by a health care provider every 1-3 years as part of a regular health exam.  If you are 107 or older, have a CBE every year. Also consider having a breast X-ray (mammogram) every year.  If you have a family history of breast cancer, talk to your health care  provider about genetic screening.  If you are at high risk for breast cancer, talk to your health care provider about having an MRI and a mammogram every year.  Breast cancer gene (BRCA) assessment is recommended for women who have family members with BRCA-related cancers. BRCA-related cancers include: ? Breast. ? Ovarian. ? Tubal. ? Peritoneal cancers.  Results of the assessment will determine the need for genetic counseling and BRCA1 and BRCA2 testing.  Cervical Cancer Your health care provider may recommend that you be screened regularly for cancer of  the pelvic organs (ovaries, uterus, and vagina). This screening involves a pelvic examination, including checking for microscopic changes to the surface of your cervix (Pap test). You may be encouraged to have this screening done every 3 years, beginning at age 21.  For women ages 33-65, health care providers may recommend pelvic exams and Pap testing every 3 years, or they may recommend the Pap and pelvic exam, combined with testing for human papilloma virus (HPV), every 5 years. Some types of HPV increase your risk of cervical cancer. Testing for HPV may also be done on women of any age with unclear Pap test results.  Other health care providers may not recommend any screening for nonpregnant women who are considered low risk for pelvic cancer and who do not have symptoms. Ask your health care provider if a screening pelvic exam is right for you.  If you have had past treatment for cervical cancer or a condition that could lead to cancer, you need Pap tests and screening for cancer for at least 20 years after your treatment. If Pap tests have been discontinued, your risk factors (such as having a new sexual partner) need to be reassessed to determine if screening should resume. Some women have medical problems that increase the chance of getting cervical cancer. In these cases, your health care provider may recommend more frequent screening and Pap tests.  Colorectal Cancer  This type of cancer can be detected and often prevented.  Routine colorectal cancer screening usually begins at 35 years of age and continues through 35 years of age.  Your health care provider may recommend screening at an earlier age if you have risk factors for colon cancer.  Your health care provider may also recommend using home test kits to check for hidden blood in the stool.  A small camera at the end of a tube can be used to examine your colon directly (sigmoidoscopy or colonoscopy). This is done to check for the  earliest forms of colorectal cancer.  Routine screening usually begins at age 24.  Direct examination of the colon should be repeated every 5-10 years through 35 years of age. However, you may need to be screened more often if early forms of precancerous polyps or small growths are found.  Skin Cancer  Check your skin from head to toe regularly.  Tell your health care provider about any new moles or changes in moles, especially if there is a change in a mole's shape or color.  Also tell your health care provider if you have a mole that is larger than the size of a pencil eraser.  Always use sunscreen. Apply sunscreen liberally and repeatedly throughout the day.  Protect yourself by wearing long sleeves, pants, a wide-brimmed hat, and sunglasses whenever you are outside.  Heart disease, diabetes, and high blood pressure  High blood pressure causes heart disease and increases the risk of stroke. High blood pressure is more likely to develop in: ?  People who have blood pressure in the high end of the normal range (130-139/85-89 mm Hg). ? People who are overweight or obese. ? People who are African American.  If you are 18-39 years of age, have your blood pressure checked every 3-5 years. If you are 40 years of age or older, have your blood pressure checked every year. You should have your blood pressure measured twice-once when you are at a hospital or clinic, and once when you are not at a hospital or clinic. Record the average of the two measurements. To check your blood pressure when you are not at a hospital or clinic, you can use: ? An automated blood pressure machine at a pharmacy. ? A home blood pressure monitor.  If you are between 55 years and 79 years old, ask your health care provider if you should take aspirin to prevent strokes.  Have regular diabetes screenings. This involves taking a blood sample to check your fasting blood sugar level. ? If you are at a normal weight and  have a low risk for diabetes, have this test once every three years after 35 years of age. ? If you are overweight and have a high risk for diabetes, consider being tested at a younger age or more often. Preventing infection Hepatitis B  If you have a higher risk for hepatitis B, you should be screened for this virus. You are considered at high risk for hepatitis B if: ? You were born in a country where hepatitis B is common. Ask your health care provider which countries are considered high risk. ? Your parents were born in a high-risk country, and you have not been immunized against hepatitis B (hepatitis B vaccine). ? You have HIV or AIDS. ? You use needles to inject street drugs. ? You live with someone who has hepatitis B. ? You have had sex with someone who has hepatitis B. ? You get hemodialysis treatment. ? You take certain medicines for conditions, including cancer, organ transplantation, and autoimmune conditions.  Hepatitis C  Blood testing is recommended for: ? Everyone born from 1945 through 1965. ? Anyone with known risk factors for hepatitis C.  Sexually transmitted infections (STIs)  You should be screened for sexually transmitted infections (STIs) including gonorrhea and chlamydia if: ? You are sexually active and are younger than 35 years of age. ? You are older than 35 years of age and your health care provider tells you that you are at risk for this type of infection. ? Your sexual activity has changed since you were last screened and you are at an increased risk for chlamydia or gonorrhea. Ask your health care provider if you are at risk.  If you do not have HIV, but are at risk, it may be recommended that you take a prescription medicine daily to prevent HIV infection. This is called pre-exposure prophylaxis (PrEP). You are considered at risk if: ? You are sexually active and do not regularly use condoms or know the HIV status of your partner(s). ? You take drugs by  injection. ? You are sexually active with a partner who has HIV.  Talk with your health care provider about whether you are at high risk of being infected with HIV. If you choose to begin PrEP, you should first be tested for HIV. You should then be tested every 3 months for as long as you are taking PrEP. Pregnancy  If you are premenopausal and you may become pregnant, ask your health   care provider about preconception counseling.  If you may become pregnant, take 400 to 800 micrograms (mcg) of folic acid every day.  If you want to prevent pregnancy, talk to your health care provider about birth control (contraception). Osteoporosis and menopause  Osteoporosis is a disease in which the bones lose minerals and strength with aging. This can result in serious bone fractures. Your risk for osteoporosis can be identified using a bone density scan.  If you are 38 years of age or older, or if you are at risk for osteoporosis and fractures, ask your health care provider if you should be screened.  Ask your health care provider whether you should take a calcium or vitamin D supplement to lower your risk for osteoporosis.  Menopause may have certain physical symptoms and risks.  Hormone replacement therapy may reduce some of these symptoms and risks. Talk to your health care provider about whether hormone replacement therapy is right for you. Follow these instructions at home:  Schedule regular health, dental, and eye exams.  Stay current with your immunizations.  Do not use any tobacco products including cigarettes, chewing tobacco, or electronic cigarettes.  If you are pregnant, do not drink alcohol.  If you are breastfeeding, limit how much and how often you drink alcohol.  Limit alcohol intake to no more than 1 drink per day for nonpregnant women. One drink equals 12 ounces of beer, 5 ounces of wine, or 1 ounces of hard liquor.  Do not use street drugs.  Do not share needles.  Ask  your health care provider for help if you need support or information about quitting drugs.  Tell your health care provider if you often feel depressed.  Tell your health care provider if you have ever been abused or do not feel safe at home. This information is not intended to replace advice given to you by your health care provider. Make sure you discuss any questions you have with your health care provider. Document Released: 10/30/2010 Document Revised: 09/22/2015 Document Reviewed: 01/18/2015 Elsevier Interactive Patient Education  [employer].

## 2018-02-25 ENCOUNTER — Other Ambulatory Visit: Payer: Self-pay | Admitting: Psychiatry

## 2018-02-25 ENCOUNTER — Telehealth: Payer: Self-pay

## 2018-02-25 DIAGNOSIS — F410 Panic disorder [episodic paroxysmal anxiety] without agoraphobia: Secondary | ICD-10-CM

## 2018-02-25 LAB — PAP IG (IMAGE GUIDED)

## 2018-02-25 NOTE — Telephone Encounter (Signed)
Per discussion with Dr Montez Morita 1) if her birth control needs to be written a special way this will need to go through her GYN.   2) Also taking the oxycodone 15 mg by mouth 6 times a day ongoing is too much- that is 90 mg of oxycodone a day! There will not be additional tablets given. If she is still having foot pain needs to go back to podiatry or orthopedic. We can also refer her to pain management if needed to manage her pain

## 2018-02-25 NOTE — Telephone Encounter (Addendum)
1.) Patient called to inquire about they way BCP was written. RX needs to indicate that patient is to take contentiously (skips sugar pills to avoid having a menstrual cycle)   2.) Patient would like to follow-up on Oxycodone, Shanda Bumps was suppose to consult with Dr.Carter. Last rx was written for a 22 day supply, last written on 02/04/18   Side Note: on average patient is taken about 6 times daily (as written), patient admits that a few days she did not have to take 6. Patient taking more due to foot pain in which she discussed with Dr.Carter.   Please advise

## 2018-02-25 NOTE — Telephone Encounter (Signed)
1.) Spoke with patient, patient states it was her understanding that Shanda Bumps was going to write BCP indicating that she can skip sugar pills. Patient states she has taken medication like this x 6 years. Patient said she will take rx as written vs going through the production of seeing a GYN   2.) Patient verbalized understanding of Jessica's response in reference to the oxycodone. Patient said she will continue with the rx as written and with the current dispense number. Patient states she signed contact and remains a patient here for [place] to handel her pain and is not interested in seeing pain management at this time.   Patient will call next week for refill on Oxycodone

## 2018-02-26 NOTE — Telephone Encounter (Signed)
Noted thank you

## 2018-03-04 ENCOUNTER — Other Ambulatory Visit: Payer: Self-pay

## 2018-03-04 DIAGNOSIS — G8929 Other chronic pain: Secondary | ICD-10-CM

## 2018-03-04 DIAGNOSIS — M5442 Lumbago with sciatica, left side: Principal | ICD-10-CM

## 2018-03-04 DIAGNOSIS — M25552 Pain in left hip: Secondary | ICD-10-CM

## 2018-03-04 NOTE — Telephone Encounter (Signed)
[place] Database verified and compliance confirmed   RX last filled 02/04/2018

## 2018-03-05 NOTE — Telephone Encounter (Signed)
Will send in refill tomorrow when it is due

## 2018-03-05 NOTE — Telephone Encounter (Signed)
Patient verbalized understanding. She is still concerned that she may run out before medication is filled by the pharmacy.

## 2018-03-05 NOTE — Telephone Encounter (Signed)
Patient has called multiple times everyday since her appointment on 02/24/18 asking that her oxycodone be refilled. It has been explained to patient several times that her medication is due to be refilled on 02/04/18. Patient called this morning at 8:15 and stated that she will run out of medication by mid-day tomorrow 03/06/18 and needs to have her medication refilled before she runs out. Rx was pended to provider on 03/05/18 for approval.

## 2018-03-06 MED ORDER — OXYCODONE HCL 15 MG PO TABS: 15.0000 mg | ORAL_TABLET | Freq: Four times a day (QID) | ORAL | 0 refills | Status: DC | PRN

## 2018-04-03 ENCOUNTER — Other Ambulatory Visit: Payer: Self-pay | Admitting: *Deleted

## 2018-04-03 DIAGNOSIS — G8929 Other chronic pain: Secondary | ICD-10-CM

## 2018-04-03 DIAGNOSIS — M25552 Pain in left hip: Secondary | ICD-10-CM

## 2018-04-03 DIAGNOSIS — M5442 Lumbago with sciatica, left side: Principal | ICD-10-CM

## 2018-04-03 NOTE — Telephone Encounter (Signed)
Patient requested refill [place] Verified LR: 03/06/2018 [facility]

## 2018-04-04 MED ORDER — OXYCODONE HCL 15 MG PO TABS: 15.0000 mg | ORAL_TABLET | Freq: Four times a day (QID) | ORAL | 0 refills | Status: DC | PRN

## 2018-04-04 NOTE — Telephone Encounter (Signed)
Patient called regarding her refill. [name] is out for today.   Forwarded Rx to Richarda Bladeinah Ngetich, NP for approval.

## 2018-05-02 ENCOUNTER — Other Ambulatory Visit: Payer: Self-pay

## 2018-05-02 DIAGNOSIS — M5442 Lumbago with sciatica, left side: Principal | ICD-10-CM

## 2018-05-02 DIAGNOSIS — G8929 Other chronic pain: Secondary | ICD-10-CM

## 2018-05-02 DIAGNOSIS — M25552 Pain in left hip: Secondary | ICD-10-CM

## 2018-05-02 MED ORDER — OXYCODONE HCL 15 MG PO TABS: 15.0000 mg | ORAL_TABLET | Freq: Four times a day (QID) | ORAL | 0 refills | Status: DC | PRN

## 2018-05-02 NOTE — Telephone Encounter (Signed)
Verified by  Data based last filled oxycodone 15 mg tab on 04/04/2018 by Richarda Blade, NP a total 90 tablets.  Patient called requesting a refill. Next appointment with Abbey Chatters, NP 08/26/2018

## 2018-05-07 ENCOUNTER — Telehealth: Payer: Self-pay

## 2018-05-07 NOTE — Telephone Encounter (Signed)
Patient called today and wanted to know if she had anyone listed to pickup medications at [facility] in the event she could not. Patient stated she wanted to make sure no one could come and pickup prescriptions or no personal information would be disclosed to anyone.. Informed patient there was no release of information paper in on file and there was no one listed to receive personal information about patient.

## 2018-05-27 ENCOUNTER — Other Ambulatory Visit: Payer: 59

## 2018-05-29 ENCOUNTER — Other Ambulatory Visit: Payer: Self-pay | Admitting: *Deleted

## 2018-05-29 DIAGNOSIS — G8929 Other chronic pain: Secondary | ICD-10-CM

## 2018-05-29 DIAGNOSIS — M25552 Pain in left hip: Secondary | ICD-10-CM

## 2018-05-29 DIAGNOSIS — M5442 Lumbago with sciatica, left side: Principal | ICD-10-CM

## 2018-05-29 NOTE — Telephone Encounter (Signed)
Patient requested refill [place] Database Verified LR: 05/02/2018 Pended Rx and sent to [facility] for approval.

## 2018-05-30 MED ORDER — OXYCODONE HCL 15 MG PO TABS: 15.0000 mg | ORAL_TABLET | Freq: Four times a day (QID) | ORAL | 0 refills | Status: DC | PRN

## 2018-06-18 ENCOUNTER — Other Ambulatory Visit: Payer: 59

## 2018-06-19 LAB — LIPID PANEL
Cholesterol: 158 mg/dL (ref <200)
HDL: 61 mg/dL (ref >=50)
LDL Cholesterol (Calc): 83 mg/dL (calc)
Non-HDL Cholesterol (Calc): 97 mg/dL (calc) (ref <130)
Total CHOL/HDL Ratio: 2.6 (calc) (ref <5.0)
Triglycerides: 63 mg/dL (ref <150)

## 2018-06-19 LAB — TSH: TSH: 4.55 mIU/L — ABNORMAL HIGH

## 2018-06-19 LAB — T4, FREE: Free T4: 1.2 ng/dL (ref 0.8–1.8)

## 2018-06-27 ENCOUNTER — Telehealth: Payer: Self-pay | Admitting: Psychiatry

## 2018-06-27 ENCOUNTER — Other Ambulatory Visit: Payer: Self-pay

## 2018-06-27 ENCOUNTER — Other Ambulatory Visit: Payer: Self-pay | Admitting: *Deleted

## 2018-06-27 DIAGNOSIS — M5442 Lumbago with sciatica, left side: Principal | ICD-10-CM

## 2018-06-27 DIAGNOSIS — G8929 Other chronic pain: Secondary | ICD-10-CM

## 2018-06-27 DIAGNOSIS — M25552 Pain in left hip: Secondary | ICD-10-CM

## 2018-06-27 MED ORDER — ALPRAZOLAM 1 MG PO TABS: 1.0000 mg | ORAL_TABLET | Freq: Three times a day (TID) | ORAL | 0 refills | Status: DC | PRN

## 2018-06-27 MED ORDER — OXYCODONE HCL 15 MG PO TABS: 15.0000 mg | ORAL_TABLET | Freq: Four times a day (QID) | ORAL | 0 refills | Status: DC | PRN

## 2018-06-27 NOTE — Progress Notes (Signed)
Refill request from pt for alprazolam 1 mg, last month used her last written prescription. Last fill 06/01/2018 #90. Has office visit 07/08/2018. [facility] [address]

## 2018-06-27 NOTE — Telephone Encounter (Signed)
Patient called and made an appointment for march 10th for 6 month follow up. She needs a refill of her xanax 1 mg [facility] [address]

## 2018-06-27 NOTE — Telephone Encounter (Signed)
Patient requested rx.  [place] Database Verified LR: 05/30/2018 Pended Rx and sent to [facility] for approval.

## 2018-06-27 NOTE — Telephone Encounter (Signed)
Need to review paper chart not seen in epic  

## 2018-06-27 NOTE — Telephone Encounter (Signed)
rx sent to pharmacy per request

## 2018-07-08 ENCOUNTER — Ambulatory Visit (INDEPENDENT_AMBULATORY_CARE_PROVIDER_SITE_OTHER): Payer: Medicaid Other | Admitting: Psychiatry

## 2018-07-08 ENCOUNTER — Encounter: Payer: Self-pay | Admitting: Psychiatry

## 2018-07-08 VITALS — BP 106/70 | HR 78 | Ht 67.0 in | Wt 144.0 lb

## 2018-07-08 DIAGNOSIS — F410 Panic disorder [episodic paroxysmal anxiety] without agoraphobia: Secondary | ICD-10-CM

## 2018-07-08 DIAGNOSIS — F4312 Post-traumatic stress disorder, chronic: Secondary | ICD-10-CM | POA: Insufficient documentation

## 2018-07-08 DIAGNOSIS — F330 Major depressive disorder, recurrent, mild: Secondary | ICD-10-CM | POA: Insufficient documentation

## 2018-07-08 MED ORDER — ESCITALOPRAM OXALATE 20 MG PO TABS: 20.0000 mg | ORAL_TABLET | Freq: Every day | ORAL | 1 refills | Status: DC

## 2018-07-08 MED ORDER — ALPRAZOLAM 1 MG PO TABS: 1.0000 mg | ORAL_TABLET | Freq: Three times a day (TID) | ORAL | 5 refills | Status: DC | PRN

## 2018-07-08 MED ORDER — AMITRIPTYLINE HCL 25 MG PO TABS: 25.0000 mg | ORAL_TABLET | Freq: Every day | ORAL | 1 refills | Status: DC
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
## 2018-07-25 ENCOUNTER — Other Ambulatory Visit: Payer: Self-pay | Admitting: *Deleted

## 2018-07-25 DIAGNOSIS — M25552 Pain in left hip: Secondary | ICD-10-CM

## 2018-07-25 DIAGNOSIS — G8929 Other chronic pain: Secondary | ICD-10-CM

## 2018-07-25 DIAGNOSIS — M5442 Lumbago with sciatica, left side: Principal | ICD-10-CM

## 2018-07-25 MED ORDER — OXYCODONE HCL 15 MG PO TABS: 15.0000 mg | ORAL_TABLET | Freq: Four times a day (QID) | ORAL | 0 refills | Status: DC | PRN

## 2018-07-25 NOTE — Telephone Encounter (Signed)
Patient requested refill [place] Database Verified LR: 06/27/2018 Pended Rx and sent to [facility] for approval.

## 2018-07-25 NOTE — Telephone Encounter (Signed)
Forwarded Rx to [employer] for approval due to [employer].

## 2018-08-02 ENCOUNTER — Other Ambulatory Visit: Payer: Self-pay | Admitting: Nurse Practitioner

## 2018-08-02 DIAGNOSIS — Z3041 Encounter for surveillance of contraceptive pills: Secondary | ICD-10-CM

## 2018-08-22 ENCOUNTER — Other Ambulatory Visit: Payer: Self-pay | Admitting: *Deleted

## 2018-08-22 DIAGNOSIS — M25552 Pain in left hip: Secondary | ICD-10-CM

## 2018-08-22 DIAGNOSIS — Z3041 Encounter for surveillance of contraceptive pills: Secondary | ICD-10-CM

## 2018-08-22 DIAGNOSIS — G8929 Other chronic pain: Secondary | ICD-10-CM

## 2018-08-22 DIAGNOSIS — M5442 Lumbago with sciatica, left side: Secondary | ICD-10-CM

## 2018-08-22 MED ORDER — NORETHIN-ETH ESTRADIOL-FE 0.4-35 MG-MCG PO CHEW: CHEWABLE_TABLET | ORAL | 0 refills | Status: DC

## 2018-08-22 MED ORDER — OXYCODONE HCL 15 MG PO TABS: 15.0000 mg | ORAL_TABLET | Freq: Four times a day (QID) | ORAL | 0 refills | Status: DC | PRN

## 2018-08-22 NOTE — Telephone Encounter (Signed)
Patient requested refill NCCSRS Database Verified LR; 07/25/2018 Pended Rx and sent to [facility] for approval.

## 2018-08-26 ENCOUNTER — Other Ambulatory Visit: Payer: Self-pay

## 2018-08-26 ENCOUNTER — Encounter: Payer: Self-pay | Admitting: Nurse Practitioner

## 2018-08-26 ENCOUNTER — Ambulatory Visit (INDEPENDENT_AMBULATORY_CARE_PROVIDER_SITE_OTHER): Payer: 59 | Admitting: Nurse Practitioner

## 2018-08-26 DIAGNOSIS — F330 Major depressive disorder, recurrent, mild: Secondary | ICD-10-CM | POA: Diagnosis not present

## 2018-08-26 DIAGNOSIS — Z720 Tobacco use: Secondary | ICD-10-CM

## 2018-08-26 DIAGNOSIS — G8929 Other chronic pain: Secondary | ICD-10-CM

## 2018-08-26 DIAGNOSIS — F410 Panic disorder [episodic paroxysmal anxiety] without agoraphobia: Secondary | ICD-10-CM

## 2018-08-26 DIAGNOSIS — M5442 Lumbago with sciatica, left side: Secondary | ICD-10-CM | POA: Diagnosis not present

## 2018-08-26 DIAGNOSIS — Z3041 Encounter for surveillance of contraceptive pills: Secondary | ICD-10-CM

## 2018-08-26 DIAGNOSIS — R7989 Other specified abnormal findings of blood chemistry: Secondary | ICD-10-CM
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
## 2018-08-26 NOTE — Patient Instructions (Signed)
To try to limit oxycodone for severe pain only To use tylenol 500 mg 1-2 tablets every 8 hours as needed and/or ibuprofen 600 mg by mouth every 6 hours as needed (can use aleve instead of ibuprofen 1 tablet twice daily).

## 2018-09-18 ENCOUNTER — Other Ambulatory Visit: Payer: Self-pay | Admitting: *Deleted

## 2018-09-18 DIAGNOSIS — M5442 Lumbago with sciatica, left side: Secondary | ICD-10-CM

## 2018-09-18 DIAGNOSIS — M25552 Pain in left hip: Secondary | ICD-10-CM

## 2018-09-18 DIAGNOSIS — G8929 Other chronic pain: Secondary | ICD-10-CM

## 2018-09-18 MED ORDER — OXYCODONE HCL 15 MG PO TABS: 15.0000 mg | ORAL_TABLET | Freq: Four times a day (QID) | ORAL | 0 refills | Status: DC | PRN

## 2018-09-18 NOTE — Telephone Encounter (Signed)
Patient called requesting refill on Oxycodone. Patient stated that she needed it alittle early due to Holiday weekend and she is going out of town.   Pended Rx and sent to [facility] due to Shanda Bumps out of office for approval.

## 2018-09-18 NOTE — Telephone Encounter (Signed)
NCCSRS Verified LR: 08/22/2018

## 2018-10-15 ENCOUNTER — Other Ambulatory Visit: Payer: Self-pay | Admitting: *Deleted

## 2018-10-15 DIAGNOSIS — Z3041 Encounter for surveillance of contraceptive pills: Secondary | ICD-10-CM

## 2018-10-15 MED ORDER — NORETHIN-ETH ESTRADIOL-FE 0.4-35 MG-MCG PO CHEW: CHEWABLE_TABLET | ORAL | 1 refills | Status: DC

## 2018-10-15 NOTE — Telephone Encounter (Signed)
Patient requested refill on her Birth Control and pain medication Pended Rx and sent to [facility] for approval. Pain medication not due till Friday. [place] Verified LR: 09/18/2018

## 2018-10-17 ENCOUNTER — Other Ambulatory Visit: Payer: Self-pay | Admitting: *Deleted

## 2018-10-17 DIAGNOSIS — M25552 Pain in left hip: Secondary | ICD-10-CM

## 2018-10-17 DIAGNOSIS — G8929 Other chronic pain: Secondary | ICD-10-CM

## 2018-10-17 MED ORDER — OXYCODONE HCL 15 MG PO TABS: 15.0000 mg | ORAL_TABLET | Freq: Four times a day (QID) | ORAL | 0 refills | Status: DC | PRN

## 2018-10-17 NOTE — Telephone Encounter (Signed)
Patient requested refill NCCSRS Database Verified LR: 09/18/2018 Pended Rx and sent to [facility] for approval.

## 2018-11-12 ENCOUNTER — Telehealth: Payer: Self-pay | Admitting: Psychiatry

## 2018-11-12 NOTE — Telephone Encounter (Signed)
Pt called to request a letter from Dr. Creig Hines stating she takes Xanax and reason for this medication. Ask to pick up no later than Friday 11/14/18. Please call ASAP when ready to pick up.

## 2018-11-12 NOTE — Telephone Encounter (Signed)
Letter is dictated and transcribed for pickup as medical professional statement of care surrounding diagnoses and treatment including Xanax.

## 2018-11-14 ENCOUNTER — Other Ambulatory Visit: Payer: Self-pay | Admitting: *Deleted

## 2018-11-14 DIAGNOSIS — G8929 Other chronic pain: Secondary | ICD-10-CM

## 2018-11-14 MED ORDER — OXYCODONE HCL 15 MG PO TABS: 15.0000 mg | ORAL_TABLET | Freq: Four times a day (QID) | ORAL | 0 refills | Status: DC | PRN

## 2018-11-14 NOTE — Telephone Encounter (Signed)
Patient requested refill [place] Verified LR: 10/17/2018 Pended Rx and sent to [place] for approval Janett Billow out of office)

## 2018-11-18 DIAGNOSIS — F4310 Post-traumatic stress disorder, unspecified: Secondary | ICD-10-CM | POA: Diagnosis not present

## 2018-12-01 ENCOUNTER — Ambulatory Visit: Payer: Medicaid Other | Admitting: Psychiatry

## 2018-12-12 ENCOUNTER — Other Ambulatory Visit: Payer: Self-pay | Admitting: *Deleted

## 2018-12-12 DIAGNOSIS — M25552 Pain in left hip: Secondary | ICD-10-CM

## 2018-12-12 DIAGNOSIS — G8929 Other chronic pain: Secondary | ICD-10-CM

## 2018-12-12 MED ORDER — OXYCODONE HCL 15 MG PO TABS: 15.0000 mg | ORAL_TABLET | Freq: Four times a day (QID) | ORAL | 0 refills | Status: DC | PRN

## 2018-12-12 NOTE — Telephone Encounter (Signed)
Patient requested refill Epic LR: 11/14/2018 Pended Rx and sent to [facility] for approval.

## 2018-12-25 ENCOUNTER — Other Ambulatory Visit: Payer: Self-pay

## 2018-12-25 DIAGNOSIS — Z20822 Contact with and (suspected) exposure to covid-19: Secondary | ICD-10-CM

## 2018-12-27 LAB — NOVEL CORONAVIRUS, NAA: SARS-CoV-2, NAA: NOT DETECTED

## 2019-01-06 ENCOUNTER — Telehealth: Payer: Self-pay | Admitting: Psychiatry

## 2019-01-06 ENCOUNTER — Other Ambulatory Visit: Payer: Self-pay

## 2019-01-06 DIAGNOSIS — F410 Panic disorder [episodic paroxysmal anxiety] without agoraphobia: Secondary | ICD-10-CM

## 2019-01-06 DIAGNOSIS — F330 Major depressive disorder, recurrent, mild: Secondary | ICD-10-CM

## 2019-01-06 DIAGNOSIS — F4312 Post-traumatic stress disorder, chronic: Secondary | ICD-10-CM

## 2019-01-06 MED ORDER — ALPRAZOLAM 1 MG PO TABS: 1.0000 mg | ORAL_TABLET | Freq: Three times a day (TID) | ORAL | 0 refills | Status: DC | PRN

## 2019-01-06 MED ORDER — ESCITALOPRAM OXALATE 20 MG PO TABS: 20.0000 mg | ORAL_TABLET | Freq: Every day | ORAL | 0 refills | Status: DC

## 2019-01-06 NOTE — Telephone Encounter (Signed)
Last appointment 07/08/2018 missing the more recent appointment following need for medical documentation of treatment underway not fully defined as last fill possible for Xanax 1 mg 3 times daily #90 with no refill also sending #90 of the Lexapro 20 mg daily no refill to [facility]

## 2019-01-06 NOTE — Telephone Encounter (Signed)
Pended for approval.

## 2019-01-06 NOTE — Telephone Encounter (Signed)
Patient need refill on Lexapro 20 mg, and Alprazolam [facility] mg [facility] [address] patient scheduled appt. For 09/21 @ 10 am

## 2019-01-09 ENCOUNTER — Other Ambulatory Visit: Payer: Self-pay | Admitting: *Deleted

## 2019-01-09 DIAGNOSIS — M5442 Lumbago with sciatica, left side: Secondary | ICD-10-CM

## 2019-01-09 DIAGNOSIS — G8929 Other chronic pain: Secondary | ICD-10-CM

## 2019-01-09 MED ORDER — OXYCODONE HCL 15 MG PO TABS: 15.0000 mg | ORAL_TABLET | Freq: Four times a day (QID) | ORAL | 0 refills | Status: DC | PRN

## 2019-01-09 NOTE — Telephone Encounter (Signed)
Patient requested refill NCCSRS Database Verified LR: 12/12/2018 Pended Rx and sent to [place] for approval Janett Billow out of office)

## 2019-01-19 ENCOUNTER — Encounter: Payer: Self-pay | Admitting: Psychiatry

## 2019-01-19 ENCOUNTER — Ambulatory Visit (INDEPENDENT_AMBULATORY_CARE_PROVIDER_SITE_OTHER): Payer: BC Managed Care – PPO | Admitting: Psychiatry

## 2019-01-19 ENCOUNTER — Other Ambulatory Visit: Payer: Self-pay

## 2019-01-19 VITALS — Ht 67.0 in | Wt 138.0 lb

## 2019-01-19 DIAGNOSIS — F330 Major depressive disorder, recurrent, mild: Secondary | ICD-10-CM | POA: Diagnosis not present

## 2019-01-19 DIAGNOSIS — F410 Panic disorder [episodic paroxysmal anxiety] without agoraphobia: Secondary | ICD-10-CM

## 2019-01-19 DIAGNOSIS — F4312 Post-traumatic stress disorder, chronic: Secondary | ICD-10-CM | POA: Diagnosis not present

## 2019-01-19 MED ORDER — ALPRAZOLAM 1 MG PO TABS: 1.0000 mg | ORAL_TABLET | Freq: Three times a day (TID) | ORAL | 5 refills | Status: DC | PRN

## 2019-01-19 MED ORDER — ESCITALOPRAM OXALATE 20 MG PO TABS: 20.0000 mg | ORAL_TABLET | Freq: Every day | ORAL | 1 refills | Status: DC
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
## 2019-02-03 ENCOUNTER — Other Ambulatory Visit: Payer: Self-pay | Admitting: Nurse Practitioner

## 2019-02-03 DIAGNOSIS — Z3041 Encounter for surveillance of contraceptive pills: Secondary | ICD-10-CM

## 2019-02-06 ENCOUNTER — Other Ambulatory Visit: Payer: Self-pay | Admitting: *Deleted

## 2019-02-06 DIAGNOSIS — M25552 Pain in left hip: Secondary | ICD-10-CM

## 2019-02-06 DIAGNOSIS — G8929 Other chronic pain: Secondary | ICD-10-CM

## 2019-02-06 MED ORDER — OXYCODONE HCL 15 MG PO TABS: 15.0000 mg | ORAL_TABLET | Freq: Four times a day (QID) | ORAL | 0 refills | Status: DC | PRN

## 2019-02-06 NOTE — Telephone Encounter (Signed)
Patient requested refill Epic LR: 01/09/2019 Pended Rx and sent to [facility] for approval.

## 2019-02-25 ENCOUNTER — Ambulatory Visit: Payer: 59 | Admitting: Nurse Practitioner

## 2019-03-06 ENCOUNTER — Other Ambulatory Visit: Payer: Self-pay | Admitting: *Deleted

## 2019-03-06 DIAGNOSIS — M25552 Pain in left hip: Secondary | ICD-10-CM

## 2019-03-06 DIAGNOSIS — G8929 Other chronic pain: Secondary | ICD-10-CM

## 2019-03-06 MED ORDER — OXYCODONE HCL 15 MG PO TABS: 15.0000 mg | ORAL_TABLET | Freq: Four times a day (QID) | ORAL | 0 refills | Status: DC | PRN

## 2019-03-06 NOTE — Telephone Encounter (Signed)
Patient requested refill [place] Verified LR: 02/06/2019 Pended Rx and sent to [facility] for approval.

## 2019-03-13 ENCOUNTER — Ambulatory Visit (INDEPENDENT_AMBULATORY_CARE_PROVIDER_SITE_OTHER): Payer: Medicaid Other | Admitting: Nurse Practitioner

## 2019-03-13 ENCOUNTER — Other Ambulatory Visit: Payer: Self-pay

## 2019-03-13 ENCOUNTER — Encounter: Payer: Self-pay | Admitting: Nurse Practitioner

## 2019-03-13 VITALS — BP 124/76 | Temp 98.4°F | Ht 67.0 in | Wt 131.6 lb

## 2019-03-13 DIAGNOSIS — M25552 Pain in left hip: Secondary | ICD-10-CM

## 2019-03-13 DIAGNOSIS — G8929 Other chronic pain: Secondary | ICD-10-CM

## 2019-03-13 DIAGNOSIS — F321 Major depressive disorder, single episode, moderate: Secondary | ICD-10-CM

## 2019-03-13 DIAGNOSIS — R7989 Other specified abnormal findings of blood chemistry: Secondary | ICD-10-CM

## 2019-03-13 DIAGNOSIS — F410 Panic disorder [episodic paroxysmal anxiety] without agoraphobia: Secondary | ICD-10-CM

## 2019-03-13 DIAGNOSIS — M5442 Lumbago with sciatica, left side: Secondary | ICD-10-CM

## 2019-03-13 DIAGNOSIS — Z720 Tobacco use: Secondary | ICD-10-CM

## 2019-03-13 DIAGNOSIS — Z9189 Other specified personal risk factors, not elsewhere classified: Secondary | ICD-10-CM

## 2019-03-13 DIAGNOSIS — Z13220 Encounter for screening for lipoid disorders: Secondary | ICD-10-CM

## 2019-03-13 MED ORDER — OXYCODONE HCL 15 MG PO TABS: 15.0000 mg | ORAL_TABLET | Freq: Three times a day (TID) | ORAL | 0 refills | Status: DC | PRN
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
## 2019-03-13 NOTE — Patient Instructions (Signed)
To completely wean off xanax  To decrease use of oxycodone, to use tylenol 500 mg 1-2 tablets every 8 hours as needed; can take tylenol with aleve  Can use aleve 220 mg by mouth twice daily as needed for pain.

## 2019-03-16 ENCOUNTER — Telehealth: Payer: Self-pay | Admitting: *Deleted

## 2019-03-16 NOTE — Telephone Encounter (Signed)
Per Sherrie Mustache, NP and Faythe Dingwall, Practice Administrator--Patient is to come into the office 03/16/2019 (Monday) for a nurse appointment for lab and Medication Review. Patient left quickly on Friday, at her appointment,  due to an emergency. Janett Billow wants patient to bring in all of her medication bottles for review and pill count.  She needs to come in by the end of the day, Monday,  03/16/2019 or she violates her pain contract and we will not be able to prescribe any pain medications for her and she will be referred to Pain Management.    Tried calling patient and LMOM to return call. Will try again later.

## 2019-03-16 NOTE — Telephone Encounter (Signed)
Per Jessica--Call patient and leave detailed message on Voicemail regarding coming in today for nurse visit and labs.  I called patient and left detailed message that she would need to come into office before 3:15 today in order to continue having her medications refilled by Janett Billow. Explained that she would need to bring in all of her medications for review and that she would need to get labs done by the end of today, 03/16/19, or it would be a violation to her pain contract and she would then be referred to pain management.    Awaiting Callback.

## 2019-03-17 ENCOUNTER — Encounter: Payer: Self-pay | Admitting: Nurse Practitioner

## 2019-03-17 NOTE — Telephone Encounter (Signed)
Letter has been sent to patient due to failure to comply with pain contract. We will not be able to refill her pain medication anymore at this due to her not completing drug screen (or pill count). Letter states she can call the office and we can give her instructions on how to safely wean herself off of the medication or refer her to pain management. She can provide [place] a pain clinic she would like to go to or we can make referral.

## 2019-03-28 DIAGNOSIS — Z20828 Contact with and (suspected) exposure to other viral communicable diseases: Secondary | ICD-10-CM | POA: Diagnosis not present

## 2019-03-28 DIAGNOSIS — J3489 Other specified disorders of nose and nasal sinuses: Secondary | ICD-10-CM | POA: Diagnosis not present

## 2019-05-21 DIAGNOSIS — M25552 Pain in left hip: Secondary | ICD-10-CM | POA: Diagnosis not present

## 2019-05-21 DIAGNOSIS — F329 Major depressive disorder, single episode, unspecified: Secondary | ICD-10-CM | POA: Diagnosis not present

## 2019-05-21 DIAGNOSIS — F419 Anxiety disorder, unspecified: Secondary | ICD-10-CM | POA: Diagnosis not present

## 2019-05-21 DIAGNOSIS — Z1331 Encounter for screening for depression: Secondary | ICD-10-CM | POA: Diagnosis not present

## 2019-05-21 DIAGNOSIS — M545 Low back pain: Secondary | ICD-10-CM | POA: Diagnosis not present

## 2019-07-10 ENCOUNTER — Other Ambulatory Visit: Payer: Self-pay | Admitting: Nurse Practitioner

## 2019-07-10 DIAGNOSIS — Z3041 Encounter for surveillance of contraceptive pills: Secondary | ICD-10-CM

## 2019-07-20 ENCOUNTER — Other Ambulatory Visit: Payer: Self-pay

## 2019-07-20 ENCOUNTER — Ambulatory Visit (INDEPENDENT_AMBULATORY_CARE_PROVIDER_SITE_OTHER): Payer: BC Managed Care – PPO | Admitting: Psychiatry

## 2019-07-20 ENCOUNTER — Encounter: Payer: Self-pay | Admitting: Psychiatry

## 2019-07-20 VITALS — Ht 67.0 in | Wt 132.0 lb

## 2019-07-20 DIAGNOSIS — F410 Panic disorder [episodic paroxysmal anxiety] without agoraphobia: Secondary | ICD-10-CM

## 2019-07-20 DIAGNOSIS — F330 Major depressive disorder, recurrent, mild: Secondary | ICD-10-CM | POA: Diagnosis not present

## 2019-07-20 DIAGNOSIS — F4312 Post-traumatic stress disorder, chronic: Secondary | ICD-10-CM

## 2019-07-20 MED ORDER — ESCITALOPRAM OXALATE 20 MG PO TABS: 20.0000 mg | ORAL_TABLET | Freq: Every day | ORAL | 1 refills | Status: DC

## 2019-07-20 MED ORDER — ALPRAZOLAM 1 MG PO TABS: 1.0000 mg | ORAL_TABLET | Freq: Two times a day (BID) | ORAL | 5 refills | Status: DC | PRN
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
## 2019-09-21 DIAGNOSIS — R5383 Other fatigue: Secondary | ICD-10-CM | POA: Diagnosis not present

## 2019-09-21 DIAGNOSIS — M545 Low back pain: Secondary | ICD-10-CM | POA: Diagnosis not present

## 2019-09-21 DIAGNOSIS — M25552 Pain in left hip: Secondary | ICD-10-CM | POA: Diagnosis not present

## 2019-09-21 DIAGNOSIS — R635 Abnormal weight gain: Secondary | ICD-10-CM | POA: Diagnosis not present

## 2019-09-29 DIAGNOSIS — M25552 Pain in left hip: Secondary | ICD-10-CM | POA: Diagnosis not present

## 2020-01-05 ENCOUNTER — Telehealth: Payer: Self-pay | Admitting: Psychiatry

## 2020-01-05 ENCOUNTER — Other Ambulatory Visit: Payer: Self-pay

## 2020-01-05 DIAGNOSIS — F4312 Post-traumatic stress disorder, chronic: Secondary | ICD-10-CM

## 2020-01-05 DIAGNOSIS — F410 Panic disorder [episodic paroxysmal anxiety] without agoraphobia: Secondary | ICD-10-CM

## 2020-01-05 MED ORDER — ALPRAZOLAM 1 MG PO TABS: 1.0000 mg | ORAL_TABLET | Freq: Two times a day (BID) | ORAL | 0 refills | Status: DC | PRN

## 2020-01-05 NOTE — Telephone Encounter (Signed)
Last appt march with [place] Registry last dispensing 12/10/2019 appropriate for refill before appt later this month

## 2020-01-05 NOTE — Telephone Encounter (Signed)
Pt. Called and said she needs the xanax to be sent to the [facility] [address] [facility] the [facility]

## 2020-01-05 NOTE — Telephone Encounter (Signed)
Pt called for refill on Alprazolam. Call to [facility] on [address]. Next appt 9/22 at 1000

## 2020-01-05 NOTE — Telephone Encounter (Signed)
Refill for Xanax request initially sent to [facility] 1 mg twice daily as needed #60 with no refill is cancelled to send in prescription to [facility]

## 2020-01-05 NOTE — Telephone Encounter (Signed)
Last refill 12/10/2019, pended for Dr. Marlyne Beards to review and send

## 2020-01-07 DIAGNOSIS — J1282 Pneumonia due to coronavirus disease 2019: Secondary | ICD-10-CM | POA: Diagnosis not present

## 2020-01-20 ENCOUNTER — Other Ambulatory Visit: Payer: Self-pay

## 2020-01-20 ENCOUNTER — Encounter: Payer: Self-pay | Admitting: Psychiatry

## 2020-01-20 ENCOUNTER — Ambulatory Visit (INDEPENDENT_AMBULATORY_CARE_PROVIDER_SITE_OTHER): Payer: BC Managed Care – PPO | Admitting: Psychiatry

## 2020-01-20 VITALS — Ht 67.0 in | Wt 127.0 lb

## 2020-01-20 DIAGNOSIS — F330 Major depressive disorder, recurrent, mild: Secondary | ICD-10-CM

## 2020-01-20 DIAGNOSIS — F410 Panic disorder [episodic paroxysmal anxiety] without agoraphobia: Secondary | ICD-10-CM

## 2020-01-20 DIAGNOSIS — F4312 Post-traumatic stress disorder, chronic: Secondary | ICD-10-CM

## 2020-01-20 MED ORDER — ESCITALOPRAM OXALATE 20 MG PO TABS: 20.0000 mg | ORAL_TABLET | Freq: Every day | ORAL | 3 refills | Status: AC

## 2020-01-20 MED ORDER — ALPRAZOLAM 1 MG PO TABS: 1.0000 mg | ORAL_TABLET | Freq: Two times a day (BID) | ORAL | 5 refills | Status: DC | PRN
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
## 2020-02-17 ENCOUNTER — Encounter: Payer: Self-pay | Admitting: Psychiatry

## 2020-03-08 ENCOUNTER — Other Ambulatory Visit: Payer: Self-pay | Admitting: Psychiatry

## 2020-03-08 DIAGNOSIS — F4312 Post-traumatic stress disorder, chronic: Secondary | ICD-10-CM

## 2020-03-08 DIAGNOSIS — F410 Panic disorder [episodic paroxysmal anxiety] without agoraphobia: Secondary | ICD-10-CM

## 2020-03-08 DIAGNOSIS — F330 Major depressive disorder, recurrent, mild: Secondary | ICD-10-CM

## 2020-04-14 ENCOUNTER — Telehealth: Payer: Self-pay | Admitting: Psychiatry

## 2020-04-14 NOTE — Telephone Encounter (Signed)
Pt called back and said that the first message that was left is wrong. Her memory is fine. She just needs a statement explaining her PTSD diagnosis. Please call back.

## 2020-04-14 NOTE — Telephone Encounter (Signed)
Pt called and said that she needs a letter for her lawyer that states her diagnosis of PTSD and the medications she is on can affect her memory. To get more info please call her at [phone]

## 2020-04-14 NOTE — Telephone Encounter (Signed)
Patient's request for phone call to clarify her request for a statement that her medications may alter her memory and therefore help her to attorney is returned leaving message as she did that we can certainly discontinue any medication that is causing side effects though there were none apparent at her last or previous appointments as of 01/20/2020. She can consider psychometric testing to measure her memory or can discuss with PCP the differential including for medication.  Appointment can be arranged at the office if needed including with advanced practitioner separate provider if helpful especially as my retirement next month limits appointment availability. However, her request requires some degree of medical certainty for her lawyer can potentially use her records to formulate whether from diagnoses or medications the best way to help her.

## 2020-04-15 DIAGNOSIS — Z79899 Other long term (current) drug therapy: Secondary | ICD-10-CM | POA: Diagnosis not present

## 2020-04-15 DIAGNOSIS — M545 Low back pain, unspecified: Secondary | ICD-10-CM | POA: Diagnosis not present

## 2020-04-15 DIAGNOSIS — G8929 Other chronic pain: Secondary | ICD-10-CM | POA: Diagnosis not present

## 2020-04-26 NOTE — Telephone Encounter (Signed)
Pt called back again today asking for  A phone call to explain the letter that she needs in  The middle of January. Please call her at [phone]

## 2020-05-04 NOTE — Telephone Encounter (Signed)
Patient explained that she just needed a statement that her PTSD is treated with medications, not certain how the initial request was laden with memory related questions.  Letter requested is provided for pickup

## 2020-07-05 ENCOUNTER — Encounter (HOSPITAL_BASED_OUTPATIENT_CLINIC_OR_DEPARTMENT_OTHER): Payer: Self-pay | Admitting: *Deleted

## 2020-07-05 ENCOUNTER — Emergency Department (HOSPITAL_BASED_OUTPATIENT_CLINIC_OR_DEPARTMENT_OTHER): Payer: BC Managed Care – PPO

## 2020-07-05 ENCOUNTER — Emergency Department (HOSPITAL_BASED_OUTPATIENT_CLINIC_OR_DEPARTMENT_OTHER)
Admission: EM | Admit: 2020-07-05 | Discharge: 2020-07-05 | Disposition: A | Discharge status: Home / Self Care | Payer: BC Managed Care – PPO | Attending: Emergency Medicine | Admitting: Emergency Medicine

## 2020-07-05 ENCOUNTER — Other Ambulatory Visit: Payer: Self-pay

## 2020-07-05 DIAGNOSIS — F17210 Nicotine dependence, cigarettes, uncomplicated: Secondary | ICD-10-CM | POA: Insufficient documentation

## 2020-07-05 DIAGNOSIS — Y9389 Activity, other specified: Secondary | ICD-10-CM | POA: Insufficient documentation

## 2020-07-05 DIAGNOSIS — W228XXA Striking against or struck by other objects, initial encounter: Secondary | ICD-10-CM | POA: Insufficient documentation

## 2020-07-05 DIAGNOSIS — M7021 Olecranon bursitis, right elbow: Secondary | ICD-10-CM | POA: Diagnosis not present

## 2020-07-05 DIAGNOSIS — S59901A Unspecified injury of right elbow, initial encounter: Secondary | ICD-10-CM | POA: Diagnosis not present

## 2020-07-05 DIAGNOSIS — Z9660 Presence of unspecified orthopedic joint implant: Secondary | ICD-10-CM | POA: Diagnosis not present

## 2020-07-05 DIAGNOSIS — M25521 Pain in right elbow: Secondary | ICD-10-CM | POA: Diagnosis not present

## 2020-07-05 DIAGNOSIS — M7989 Other specified soft tissue disorders: Secondary | ICD-10-CM | POA: Diagnosis not present

## 2020-07-05 MED ORDER — PENTAFLUOROPROP-TETRAFLUOROETH EX AERO
INHALATION_SPRAY | Freq: Once | CUTANEOUS | Status: AC
  Administered 2020-07-05: 30 via TOPICAL
  Filled 2020-07-05: qty 30

## 2020-07-05 MED ORDER — CEPHALEXIN 500 MG PO CAPS: 500.0000 mg | ORAL_CAPSULE | Freq: Four times a day (QID) | ORAL | 0 refills | Status: DC

## 2020-07-05 MED ORDER — NAPROXEN 500 MG PO TABS: 500.0000 mg | ORAL_TABLET | Freq: Two times a day (BID) | ORAL | 0 refills | Status: DC

## 2020-07-05 MED ORDER — KETOROLAC TROMETHAMINE 30 MG/ML IJ SOLN
30.0000 mg | Freq: Once | INTRAMUSCULAR | Status: AC
  Administered 2020-07-05: 30 mg via INTRAMUSCULAR
  Filled 2020-07-05: qty 1
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
## 2020-07-05 NOTE — ED Notes (Signed)
Patient transported to X-ray 

## 2020-07-05 NOTE — ED Notes (Signed)
ED Provider at bedside. 
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
## 2020-08-16 DIAGNOSIS — Z0000 Encounter for general adult medical examination without abnormal findings: Secondary | ICD-10-CM | POA: Diagnosis not present

## 2020-08-16 DIAGNOSIS — Z79899 Other long term (current) drug therapy: Secondary | ICD-10-CM | POA: Diagnosis not present

## 2020-08-16 DIAGNOSIS — R7989 Other specified abnormal findings of blood chemistry: Secondary | ICD-10-CM | POA: Diagnosis not present

## 2020-08-19 DIAGNOSIS — R82998 Other abnormal findings in urine: Secondary | ICD-10-CM | POA: Diagnosis not present

## 2020-08-23 DIAGNOSIS — Z0000 Encounter for general adult medical examination without abnormal findings: Secondary | ICD-10-CM | POA: Diagnosis not present

## 2020-08-23 DIAGNOSIS — M545 Low back pain, unspecified: Secondary | ICD-10-CM | POA: Diagnosis not present

## 2021-02-23 DIAGNOSIS — M545 Low back pain, unspecified: Secondary | ICD-10-CM | POA: Diagnosis not present

## 2021-03-12 ENCOUNTER — Other Ambulatory Visit: Payer: Self-pay | Admitting: Psychiatry

## 2021-03-12 DIAGNOSIS — F410 Panic disorder [episodic paroxysmal anxiety] without agoraphobia: Secondary | ICD-10-CM

## 2021-03-12 DIAGNOSIS — F330 Major depressive disorder, recurrent, mild: Secondary | ICD-10-CM

## 2021-03-12 DIAGNOSIS — F4312 Post-traumatic stress disorder, chronic: Secondary | ICD-10-CM
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
## 2021-08-28 ENCOUNTER — Encounter: Payer: Self-pay | Admitting: *Deleted

## 2021-08-29 ENCOUNTER — Encounter: Payer: Self-pay | Admitting: Neurology

## 2021-08-29 ENCOUNTER — Encounter: Payer: Self-pay | Admitting: *Deleted

## 2021-08-29 ENCOUNTER — Ambulatory Visit: Payer: BC Managed Care – PPO | Admitting: Neurology

## 2021-08-29 VITALS — BP 111/66 | HR 65 | Ht 66.5 in | Wt 139.4 lb

## 2021-08-29 DIAGNOSIS — G4719 Other hypersomnia: Secondary | ICD-10-CM | POA: Diagnosis not present

## 2021-08-29 DIAGNOSIS — Z79891 Long term (current) use of opiate analgesic: Secondary | ICD-10-CM

## 2021-08-29 DIAGNOSIS — Z820 Family history of epilepsy and other diseases of the nervous system: Secondary | ICD-10-CM

## 2021-08-29 DIAGNOSIS — G4700 Insomnia, unspecified: Secondary | ICD-10-CM

## 2021-08-29 DIAGNOSIS — R0683 Snoring: Secondary | ICD-10-CM
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
## 2021-08-29 NOTE — Patient Instructions (Signed)

## 2021-10-10 ENCOUNTER — Telehealth: Payer: Self-pay | Admitting: Neurology

## 2021-10-10 NOTE — Telephone Encounter (Signed)
10/04/21 [employer] no auth req spoke to Raymond Ref # [id number] EE left VM 10/09/21 [place]

## 2021-10-18 NOTE — Telephone Encounter (Signed)
LVM for pt to call back to schedule.

## 2022-02-27 DIAGNOSIS — Z0000 Encounter for general adult medical examination without abnormal findings: Secondary | ICD-10-CM | POA: Diagnosis not present

## 2022-02-27 DIAGNOSIS — R7989 Other specified abnormal findings of blood chemistry: Secondary | ICD-10-CM | POA: Diagnosis not present

## 2022-03-06 DIAGNOSIS — F5101 Primary insomnia: Secondary | ICD-10-CM | POA: Diagnosis not present

## 2022-03-06 DIAGNOSIS — Z0000 Encounter for general adult medical examination without abnormal findings: Secondary | ICD-10-CM | POA: Diagnosis not present

## 2022-03-06 DIAGNOSIS — R82998 Other abnormal findings in urine: Secondary | ICD-10-CM | POA: Diagnosis not present

## 2022-07-17 ENCOUNTER — Ambulatory Visit (INDEPENDENT_AMBULATORY_CARE_PROVIDER_SITE_OTHER): Payer: Medicaid Other

## 2022-07-17 ENCOUNTER — Encounter (HOSPITAL_COMMUNITY): Payer: Self-pay

## 2022-07-17 ENCOUNTER — Ambulatory Visit (HOSPITAL_COMMUNITY)
Admission: EM | Admit: 2022-07-17 | Discharge: 2022-07-17 | Disposition: A | Discharge status: Home / Self Care | Payer: Medicaid Other | Attending: Family Medicine | Admitting: Family Medicine

## 2022-07-17 DIAGNOSIS — L02511 Cutaneous abscess of right hand: Secondary | ICD-10-CM | POA: Diagnosis not present

## 2022-07-17 DIAGNOSIS — M79644 Pain in right finger(s): Secondary | ICD-10-CM | POA: Diagnosis not present

## 2022-07-17 MED ORDER — LIDOCAINE HCL (PF) 1 % IJ SOLN
INTRAMUSCULAR | Status: AC
  Filled 2022-07-17: qty 4

## 2022-07-17 MED ORDER — LIDOCAINE HCL (PF) 1 % IJ SOLN
INTRAMUSCULAR | Status: AC
  Filled 2022-07-17: qty 30

## 2022-07-17 MED ORDER — DOXYCYCLINE HYCLATE 100 MG PO CAPS: 100.0000 mg | ORAL_CAPSULE | Freq: Two times a day (BID) | ORAL | 0 refills | Status: DC

## 2022-07-17 MED ORDER — CEFTRIAXONE SODIUM 1 G IJ SOLR
1.0000 g | Freq: Once | INTRAMUSCULAR | Status: AC
  Administered 2022-07-17: 1 g via INTRAMUSCULAR

## 2022-07-17 MED ORDER — CEFTRIAXONE SODIUM 1 G IJ SOLR
INTRAMUSCULAR | Status: AC
  Filled 2022-07-17: qty 10

## 2022-07-17 NOTE — Discharge Instructions (Signed)
Continue taking your pain medication in addition to the antibiotic I have prescribed.  Meds ordered this encounter  Medications   doxycycline (VIBRAMYCIN) 100 MG capsule    Sig: Take 1 capsule (100 mg total) by mouth 2 (two) times daily.    Dispense:  20 capsule    Refill:  0   cefTRIAXone (ROCEPHIN) injection 1 g

## 2022-07-17 NOTE — ED Triage Notes (Signed)
Pt states right middle might be infected.  Right middle finger red swollen with open sores on it. States she put antibiotic ointment on it at home.
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
## 2022-11-05 ENCOUNTER — Inpatient Hospital Stay: Payer: Medicaid Other

## 2022-11-05 ENCOUNTER — Telehealth: Payer: Self-pay | Admitting: Hematology and Oncology

## 2022-11-05 ENCOUNTER — Inpatient Hospital Stay: Payer: Medicaid Other | Admitting: Hematology and Oncology

## 2022-11-17 ENCOUNTER — Inpatient Hospital Stay: Payer: Medicaid Other | Attending: Hematology and Oncology | Admitting: Hematology and Oncology

## 2022-11-17 ENCOUNTER — Inpatient Hospital Stay: Payer: Medicaid Other

## 2022-11-17 DIAGNOSIS — D6851 Activated protein C resistance: Secondary | ICD-10-CM | POA: Insufficient documentation

## 2022-11-17 NOTE — Assessment & Plan Note (Deleted)
Counseling: Patient has heterozygous factor V Leiden and has a 6-8 fold of increased risk of venous thromboembolism.  Factor V Leiden: I discussed with the patient how factor V is critical in coagulation cascade.  Once factor V gets activated, it causes activation of prothrombin which then generates thrombin (clot).  Generally factor V is inactivated by activated protein C.  However when patients have a mutation in the factor V, it causes activated protein C resistance which causes factor V not to be cleaved.  This ultimately results in hypercoagulability.   Recommendation: Although factor V Leiden heterozygosity increased risk of venous thromboembolism, it alone is not an indication for prophylactic anticoagulation.  However other risk factors need to be mitigated.  Therefore I do not recommend that patient should receive any oral contraceptive therapy/hormone therapies, avoid high risk situations like tobacco use, Sedantariness, prolonged immobilization, pregnancy etc.

## 2023-01-05 ENCOUNTER — Ambulatory Visit (HOSPITAL_COMMUNITY)
Admission: EM | Admit: 2023-01-05 | Discharge: 2023-01-07 | Disposition: A | Discharge status: Other Acute Inpatient Hospital | Payer: MEDICAID | Attending: Nurse Practitioner | Admitting: Nurse Practitioner

## 2023-01-05 DIAGNOSIS — Z1152 Encounter for screening for COVID-19: Secondary | ICD-10-CM | POA: Insufficient documentation

## 2023-01-05 DIAGNOSIS — F1990 Other psychoactive substance use, unspecified, uncomplicated: Secondary | ICD-10-CM

## 2023-01-05 DIAGNOSIS — F333 Major depressive disorder, recurrent, severe with psychotic symptoms: Secondary | ICD-10-CM | POA: Insufficient documentation

## 2023-01-05 DIAGNOSIS — R45851 Suicidal ideations: Secondary | ICD-10-CM | POA: Insufficient documentation

## 2023-01-05 MED ORDER — ALUM & MAG HYDROXIDE-SIMETH 200-200-20 MG/5ML PO SUSP: 30.0000 mL | ORAL | Status: DC | PRN

## 2023-01-05 MED ORDER — HYDROXYZINE HCL 25 MG PO TABS
25.0000 mg | ORAL_TABLET | Freq: Three times a day (TID) | ORAL | Status: DC | PRN
  Administered 2023-01-06: 25 mg via ORAL
  Filled 2023-01-05 (×2): qty 1

## 2023-01-05 MED ORDER — TRAZODONE HCL 50 MG PO TABS
50.0000 mg | ORAL_TABLET | Freq: Every evening | ORAL | Status: DC | PRN
  Administered 2023-01-06: 50 mg via ORAL
  Filled 2023-01-05: qty 1

## 2023-01-05 MED ORDER — MAGNESIUM HYDROXIDE 400 MG/5ML PO SUSP: 30.0000 mL | Freq: Every day | ORAL | Status: DC | PRN

## 2023-01-05 NOTE — Progress Notes (Signed)
   01/05/23 2101  [facility] Triage Screening (Walk-ins at [facility] only)  How Did You Hear About [place]? Legal System  What Is the Reason for Your Visit/Call Today? Patient presents to [facility] under IVC by [employer]. IVC reports active SI and subsance use.  How Long Has This Been Causing You Problems? 1 wk - 1 month  Have You Recently Had Any Thoughts About Hurting Yourself? Yes  How long ago did you have thoughts about hurting yourself? Regular basis  Are You Planning to Commit Suicide/Harm Yourself At This time? No  Have you Recently Had Thoughts About Hurting Someone [name]? No  Are You Planning To Harm Someone At This Time? No  Are you currently experiencing any auditory, visual or other hallucinations? No  Have You Used Any Alcohol or Drugs in the Past 24 Hours? Yes  How long ago did you use Drugs or Alcohol? Officers found traces of fentanyl and drug paraphernailia  What Did You Use and How Much? Unknown  Do you have any current medical co-morbidities that require immediate attention? No  Clinician description of patient physical appearance/behavior: Patient is fairly groomed and cooperative  What Do You Feel Would Help You the Most Today? Treatment for Depression or other mood problem;Alcohol or Drug Use Treatment  If access to [facility] was not available, would you have sought care in the Emergency Department? Yes  Determination of Need Emergent (2 hours)  Options For Referral Facility-Based Crisis
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
## 2023-01-05 NOTE — Progress Notes (Signed)
   01/05/23 2101  [facility] Triage Screening (Walk-ins at [facility] only)  How Did You Hear About [place]? Legal System  What Is the Reason for Your Visit/Call Today? Patient presents to [facility] under IVC by [employer]. IVC reports active SI and substance use.  How Long Has This Been Causing You Problems? 1 wk - 1 month  Have You Recently Had Any Thoughts About Hurting Yourself? Yes  How long ago did you have thoughts about hurting yourself? Regular basis  Are You Planning to Commit Suicide/Harm Yourself At This time? No  Have you Recently Had Thoughts About Hurting Someone [name]? No  Are You Planning To Harm Someone At This Time? No  Are you currently experiencing any auditory, visual or other hallucinations? No  Have You Used Any Alcohol or Drugs in the Past 24 Hours? Yes  How long ago did you use Drugs or Alcohol? Officers found traces of fentanyl and drug paraphernailia  What Did You Use and How Much? Unknown  Do you have any current medical co-morbidities that require immediate attention? No  Clinician description of patient physical appearance/behavior: Patient is fairly groomed and cooperative  What Do You Feel Would Help You the Most Today? Treatment for Depression or other mood problem;Alcohol or Drug Use Treatment  If access to [facility] was not available, would you have sought care in the Emergency Department? Yes  Determination of Need Emergent (2 hours)  Options For Referral Facility-Based Crisis

## 2023-01-05 NOTE — ED Provider Notes (Signed)
[facility] Continuous Assessment Admission H&P  Date: 01/06/23 Patient Name: [name] MRN: [id number] Chief Complaint: IVC  Diagnoses:  Final diagnoses:  Suicidal ideation  Polysubstance use disorder  Severe episode of recurrent major depressive disorder, with psychotic features (HCC)    HPI: [name] is a 42 year old female with a psychiatric history of anxiety depression and substance abuse (oxycodone) presenting to [facility] via [facility] after being involuntarily Committed by her daughter [name].   Petitioner-[name]-Officers found traces of fentanyl and drug paraphernalia in the victims presence.  She does drugs often and allegedly goes to drug treatment center each morning in [place].  She states on a regular basis she wishes she was dead.  Prior months she made a statement that she had a gun she would blow her freaking brains out.  Nurse petitioner assessed patient face-to-face and reviewed her chart.  On assessment patient is lying on the bench in the assessment room. Patient is alert oriented x4, speech is clear but pressured, thought process is linear and goal-directed, mood is anxious with congruent affect.  Nurse practitioner and TTS spoke by phone with patient's daughter-petitioner [name] who stated that patient has been making bizarre and paranoid statements about her car being tampered with and someone has hacked her phone and is recording her phone calls. [name] reports that patient has made verbal threats to shoot herself and has also sent text messages to this effect to her mother.  [name] reports that she believes patient is using drugs because she is constantly asking for money and has been pawning her grandmother's belongings and leaves the home at odd hours of the night. [name] reports that patient lost her job in the beginning of the year and she had been abusing oxycodone and the medication was stopped. [name] reports that  patient has been going to [facility] since April.  Patient is a 42 year old female, single unemployed lives in an apartment above her parent's garage patient reports that her mother and sister think she is on drugs but she is only taking methadone from the methadone clinic.  Patient states that she stopped oxycodone about 6 months ago.  Patient endorses feeling anxious, depressed, antsy, frustrated, worried, irritable, worthless and hopeless and isolative. Patient reports that she has lost about 20-30 lbs in the last two months. Patient acknowledges that she has made suicidal threats such as " I want to shoot myself".  Patient states that she wants a relationship with her daughter sister and mother but they are always "jabbing at me and making me feel like a piece of shit". Patient reports that she feels someone has hacked her phone and has been driving her car and changing things in her car. Patient seems paranoid and is minimizing her substance use.   Patient will be admitted to [facility] cont observation for crisis management, safety and stabiluization.  Total Time spent with patient: 20 minutes  Musculoskeletal  Strength & Muscle Tone: within normal limits Gait & Station: normal Patient leans: N/A  Psychiatric Specialty Exam  Presentation General Appearance:  Casual  Eye Contact: Fair  Speech: Clear and Coherent  Speech Volume: Normal  Handedness: Right   Mood and Affect  Mood: Depressed  Affect: Blunt   Thought Process  Thought Processes: Coherent  Descriptions of Associations:Intact  Orientation:Full (Time, Place and Person)  Thought Content:WDL    Hallucinations:Hallucinations: None  Ideas of Reference:None  Suicidal Thoughts:Suicidal Thoughts: Yes, Passive SI  Passive Intent and/or Plan: Without Intent; Without Plan  Homicidal Thoughts:Homicidal Thoughts: No   Sensorium  Memory: Immediate Fair; Recent Fair; Remote  Fair  Judgment: Fair  Insight: Fair   Art therapist  Concentration: Fair  Attention Span: Fair  Recall: Good  Fund of Knowledge: Good  Language: Good   Psychomotor Activity  Psychomotor Activity: Psychomotor Activity: Normal   Assets  Assets: Communication Skills; Physical Health; Resilience   Sleep  Sleep: Sleep: Fair Number of Hours of Sleep: 5   Nutritional Assessment (For OBS and FBC admissions only) Has the patient had a weight loss or gain of 10 pounds or more in the last 3 months?: Yes Has the patient had a decrease in food intake/or appetite?: No Does the patient have dental problems?: No Does the patient have eating habits or behaviors that may be indicators of an eating disorder including binging or inducing vomiting?: No Has the patient recently lost weight without trying?: 1 Has the patient been eating poorly because of a decreased appetite?: 0 Malnutrition Screening Tool Score: 1    Physical Exam Constitutional:      Appearance: Normal appearance.  HENT:     Head: Normocephalic and atraumatic.     Nose: Nose normal.  Eyes:     Pupils: Pupils are equal, round, and reactive to light.  Cardiovascular:     Rate and Rhythm: Normal rate.  Pulmonary:     Effort: Pulmonary effort is normal.  Abdominal:     General: Abdomen is flat.  Musculoskeletal:        General: Normal range of motion.     Cervical back: Normal range of motion.  Skin:    General: Skin is warm.  Neurological:     Mental Status: She is alert and oriented to person, place, and time.  Psychiatric:        Attention and Perception: Attention normal. She does not perceive auditory or visual hallucinations.        Mood and Affect: Mood is anxious and depressed.        Speech: Speech is rapid and pressured.        Behavior: Behavior is cooperative.        Thought Content: Thought content is paranoid. Thought content includes suicidal ideation.        Cognition and  Memory: Cognition normal.        Judgment: Judgment is impulsive.    Review of Systems  Constitutional: Negative.   HENT: Negative.    Eyes: Negative.   Respiratory: Negative.    Cardiovascular: Negative.   Gastrointestinal: Negative.   Genitourinary: Negative.   Musculoskeletal: Negative.   Skin: Negative.   Neurological: Negative.   Endo/Heme/Allergies: Negative.   Psychiatric/Behavioral:  Positive for depression, substance abuse and suicidal ideas.     Blood pressure (!) 144/93, pulse 91, temperature 98 F (36.7 C), temperature source Oral, resp. rate 18, SpO2 99%. There is no height or weight on file to calculate BMI.  Past Psychiatric History: substance abuse,  MDD, GAD, PTSD  Is the patient at risk to self? Yes  Has the patient been a risk to self in the past 6 months? Yes .    Has the patient been a risk to self within the distant past? Yes   Is the patient a risk to others? No   Has the patient been a risk to others in the past 6 months? No   Has the patient been a risk to others within the  distant past? No   Past Medical History: Hip fracture  Family History: Maternal Aunt-Depression  Social History: 51 y/o single female unemployed lives in apartment above he parents garage  Last Labs:  Admission on 01/05/2023  Component Date Value Ref Range Status   WBC 01/05/2023 7.8  4.0 - 10.5 K/uL Final   RBC 01/05/2023 4.60  3.87 - 5.11 MIL/uL Final   Hemoglobin 01/05/2023 12.6  12.0 - 15.0 g/dL Final   HCT 28/41/3244 39.8  36.0 - 46.0 % Final   MCV 01/05/2023 86.5  80.0 - 100.0 fL Final   MCH 01/05/2023 27.4  26.0 - 34.0 pg Final   MCHC 01/05/2023 31.7  30.0 - 36.0 g/dL Final   RDW 05/02/7251 13.0  11.5 - 15.5 % Final   Platelets 01/05/2023 286  150 - 400 K/uL Final   nRBC 01/05/2023 0.0  0.0 - 0.2 % Final   Neutrophils Relative % 01/05/2023 61  % Final   Neutro Abs 01/05/2023 4.8  1.7 - 7.7 K/uL Final   Lymphocytes Relative 01/05/2023 30  % Final   Lymphs Abs  01/05/2023 2.3  0.7 - 4.0 K/uL Final   Monocytes Relative 01/05/2023 7  % Final   Monocytes Absolute 01/05/2023 0.6  0.1 - 1.0 K/uL Final   Eosinophils Relative 01/05/2023 1  % Final   Eosinophils Absolute 01/05/2023 0.1  0.0 - 0.5 K/uL Final   Basophils Relative 01/05/2023 1  % Final   Basophils Absolute 01/05/2023 0.1  0.0 - 0.1 K/uL Final   Immature Granulocytes 01/05/2023 0  % Final   Abs Immature Granulocytes 01/05/2023 0.02  0.00 - 0.07 K/uL Final   Performed at [facility], [address], [place], [place] [address]   Sodium 01/05/2023 139  135 - 145 mmol/L Final   Potassium 01/05/2023 2.9 (L)  3.5 - 5.1 mmol/L Final   Chloride 01/05/2023 100  98 - 111 mmol/L Final   CO2 01/05/2023 29  22 - 32 mmol/L Final   Glucose, Bld 01/05/2023 101 (H)  70 - 99 mg/dL Final   Glucose reference range applies only to samples taken after fasting for at least 8 hours.   BUN 01/05/2023 15  6 - 20 mg/dL Final   Creatinine, Ser 01/05/2023 0.95  0.44 - 1.00 mg/dL Final   Calcium 34/74/2595 9.4  8.9 - 10.3 mg/dL Final   Total Protein 63/87/5643 6.9  6.5 - 8.1 g/dL Final   Albumin 32/95/1884 3.8  3.5 - 5.0 g/dL Final   AST 16/60/6301 14 (L)  15 - 41 U/L Final   ALT 01/05/2023 12  0 - 44 U/L Final   Alkaline Phosphatase 01/05/2023 65  38 - 126 U/L Final   Total Bilirubin 01/05/2023 0.6  0.3 - 1.2 mg/dL Final   GFR, Estimated 01/05/2023 >60  >60 mL/min Final   Comment: (NOTE) Calculated using the CKD-EPI Creatinine Equation (2021)    Anion gap 01/05/2023 10  5 - 15 Final   Performed at [facility], [address], [place], [place] [address]   Cholesterol 01/05/2023 175  0 - 200 mg/dL Final   Triglycerides 32/35/5732 74  <150 mg/dL Final   HDL 20/25/4270 60  >40 mg/dL Final   Total CHOL/HDL Ratio 01/05/2023 2.9  RATIO Final   VLDL 01/05/2023 15  0 - 40 mg/dL Final   LDL Cholesterol 01/05/2023 100 (H)  0 - 99 mg/dL Final   Comment:        Total Cholesterol/HDL:CHD  Risk Coronary Heart  Disease Risk Table                     Men   Women  1/2 Average Risk   3.4   3.3  Average Risk       5.0   4.4  2 X Average Risk   9.6   7.1  3 X Average Risk  23.4   11.0        Use the calculated Patient Ratio above and the CHD Risk Table to determine the patient's CHD Risk.        ATP III CLASSIFICATION (LDL):  <100     mg/dL   Optimal  638-756  mg/dL   Near or Above                    Optimal  130-159  mg/dL   Borderline  433-295  mg/dL   High  >188     mg/dL   Very High Performed at [facility], [address], [place], [place] [address]    POC Amphetamine UR 01/06/2023 None Detected  NONE DETECTED (Cut Off Level 1000 ng/mL) Final   POC Secobarbital (BAR) 01/06/2023 None Detected  NONE DETECTED (Cut Off Level 300 ng/mL) Final   POC Buprenorphine (BUP) 01/06/2023 None Detected  NONE DETECTED (Cut Off Level 10 ng/mL) Final   POC Oxazepam (BZO) 01/06/2023 Positive (A)  NONE DETECTED (Cut Off Level 300 ng/mL) Final   POC Cocaine UR 01/06/2023 Positive (A)  NONE DETECTED (Cut Off Level 300 ng/mL) Final   POC Methamphetamine UR 01/06/2023 None Detected  NONE DETECTED (Cut Off Level 1000 ng/mL) Final   POC Morphine 01/06/2023 None Detected  NONE DETECTED (Cut Off Level 300 ng/mL) Final   POC Methadone UR 01/06/2023 Positive (A)  NONE DETECTED (Cut Off Level 300 ng/mL) Final   POC Oxycodone UR 01/06/2023 None Detected  NONE DETECTED (Cut Off Level 100 ng/mL) Final   POC Marijuana UR 01/06/2023 Positive (A)  NONE DETECTED (Cut Off Level 50 ng/mL) Final   Preg Test, Ur 01/06/2023 Negative  Negative Final   TSH 01/05/2023 2.913  0.350 - 4.500 uIU/mL Final   Comment: Performed by a 3rd Generation assay with a functional sensitivity of <=0.01 uIU/mL. Performed at [facility], [address] [place], [place] [address]     Allergies: Hydrocodone-acetaminophen and Zithromax [azithromycin]  Medications:  Facility Ordered Medications  Medication   alum & mag  hydroxide-simeth (MAALOX/MYLANTA) 200-200-20 MG/5ML suspension 30 mL   magnesium hydroxide (MILK OF MAGNESIA) suspension 30 mL   traZODone (DESYREL) tablet 50 mg   hydrOXYzine (ATARAX) tablet 25 mg   potassium chloride SA (KLOR-CON M) CR tablet 40 mEq   PTA Medications  Medication Sig   Norethin-Eth Estradiol-Fe (WYMZYA FE) 0.4-35 MG-MCG tablet Chew 1 tablet by mouth daily. DX Z30.41   oxyCODONE (ROXICODONE) 15 MG immediate release tablet Take 1 tablet (15 mg total) by mouth every 8 (eight) hours as needed for pain (as needed for severe pain).   escitalopram (LEXAPRO) 20 MG tablet Take 1 tablet (20 mg total) by mouth at bedtime.   ALPRAZolam (XANAX) 0.5 MG tablet Take 0.5 mg by mouth at bedtime.   doxycycline (VIBRAMYCIN) 100 MG capsule Take 1 capsule (100 mg total) by mouth 2 (two) times daily.      Medical Decision Making  [name] is a 42 year old female with a psychiatric history of anxiety depression and  substance abuse (oxycodone) presenting to [facility] via [facility] after being involuntarily Committed by her daughter [name].  Petitioner-[name]-Officers found traces of fentanyl and drug paraphernalia in the victims presence.  She does drugs often and allegedly goes to drug treatment center each morning in [place].  She states on a regular basis she wishes she was dead.  Prior months she made a statement that she had a gun she would blow her freaking brains out.    Recommendations  Based on my evaluation the patient does not appear to have an emergency medical condition.  Patient will be admitted to [facility] cont observation for crisis management, safety and stabiluization.  Jasper Riling, NP 01/06/23  4:57 AM

## 2023-01-05 NOTE — ED Triage Notes (Signed)
Patient presents to [facility] under IVC by [employer]. IVC reports active SI and substance use. Patient is emergent.

## 2023-01-05 NOTE — ED Provider Notes (Incomplete)
[facility] Continuous Assessment Admission H&P  Date: 01/05/23 Patient Name: [name] MRN: [id number] Chief Complaint: IVC  Diagnoses:  Final diagnoses:  None    HPI: Pamela Heidrick. Gardner is a 42 year old female with a psychiatric history of anxiety depression and substance abuse (oxycodone) presenting to [facility] Involuntarily Committed via [employer].   Petitioner-    Total Time spent with patient: 20 minutes  Musculoskeletal  Strength & Muscle Tone: within normal limits Gait & Station: normal Patient leans: N/A  Psychiatric Specialty Exam  Presentation General Appearance:  Casual  Eye Contact: Fair  Speech: Clear and Coherent  Speech Volume: Normal  Handedness: Right   Mood and Affect  Mood: Depressed  Affect: Blunt   Thought Process  Thought Processes: Coherent  Descriptions of Associations:Intact  Orientation:Full (Time, Place and Person)  Thought Content:WDL    Hallucinations:Hallucinations: None  Ideas of Reference:None  Suicidal Thoughts:Suicidal Thoughts: Yes, Passive SI Passive Intent and/or Plan: Without Intent; Without Plan  Homicidal Thoughts:Homicidal Thoughts: No   Sensorium  Memory: Immediate Fair; Recent Fair; Remote Fair  Judgment: Fair  Insight: Fair   Art therapist  Concentration: Fair  Attention Span: Fair  Recall: Good  Fund of Knowledge: Good  Language: Good   Psychomotor Activity  Psychomotor Activity: Psychomotor Activity: Normal   Assets  Assets: Communication Skills; Physical Health; Resilience   Sleep  Sleep: Sleep: Fair Number of Hours of Sleep: 5   Nutritional Assessment (For OBS and FBC admissions only) Has the patient had a weight loss or gain of 10 pounds or more in the last 3 months?: Yes Has the patient had a decrease in food intake/or appetite?: No Does the patient have dental problems?: No Does the patient have eating habits or behaviors that may be indicators  of an eating disorder including binging or inducing vomiting?: No Has the patient recently lost weight without trying?: 1 Has the patient been eating poorly because of a decreased appetite?: 0 Malnutrition Screening Tool Score: 1    Physical Exam Constitutional:      Appearance: Normal appearance.  HENT:     Head: Normocephalic and atraumatic.     Nose: Nose normal.  Eyes:     Pupils: Pupils are equal, round, and reactive to light.  Cardiovascular:     Rate and Rhythm: Normal rate.  Pulmonary:     Effort: Pulmonary effort is normal.  Abdominal:     General: Abdomen is flat.  Musculoskeletal:        General: Normal range of motion.     Cervical back: Normal range of motion.  Skin:    General: Skin is warm.  Neurological:     Mental Status: She is alert and oriented to person, place, and time.  Psychiatric:        Attention and Perception: Attention normal. She does not perceive auditory or visual hallucinations.        Mood and Affect: Mood is anxious and depressed.        Speech: Speech is rapid and pressured.        Behavior: Behavior is cooperative.        Thought Content: Thought content is paranoid. Thought content includes suicidal ideation.        Cognition and Memory: Cognition normal.        Judgment: Judgment is impulsive.    Review of Systems  Constitutional: Negative.   HENT: Negative.    Eyes: Negative.   Respiratory: Negative.    Cardiovascular:  Negative.   Gastrointestinal: Negative.   Genitourinary: Negative.   Musculoskeletal: Negative.   Skin: Negative.   Neurological: Negative.   Endo/Heme/Allergies: Negative.   Psychiatric/Behavioral:  Positive for depression, substance abuse and suicidal ideas.     Blood pressure (!) 144/93, pulse 91, temperature 98 F (36.7 C), temperature source Oral, resp. rate 18, SpO2 99%. There is no height or weight on file to calculate BMI.  Past Psychiatric History: ***   Is the patient at risk to self? Yes  Has  the patient been a risk to self in the past 6 months? Yes .    Has the patient been a risk to self within the distant past? Yes   Is the patient a risk to others? No   Has the patient been a risk to others in the past 6 months? No   Has the patient been a risk to others within the distant past? No   Past Medical History:   Family History: Maternal Aunt-Depression  Social History: 55 y/o single female unemployed lives in apartment above he parents garage  Last Labs:  No visits with results within 6 Month(s) from this visit.  Latest known visit with results is:  Orders Only on 12/25/2018  Component Date Value Ref Range Status  . SARS-CoV-2, NAA 12/25/2018 Not Detected  Not Detected Final   Comment: Testing was performed using the cobas(R) SARS-CoV-2 test. This test was developed and its performance characteristics determined by [employer]. This test has not been FDA cleared or approved. This test has been authorized by FDA under an Emergency Use Authorization (EUA). This test is only authorized for the duration of time the declaration that circumstances exist justifying the authorization of the emergency use of in vitro diagnostic tests for detection of SARS-CoV-2 virus and/or diagnosis of COVID-19 infection under section 564(b)(1) of the Act, 21 U.S.C. 409WJX-9(J)(4), unless the authorization is terminated or revoked sooner. When diagnostic testing is negative, the possibility of a false negative result should be considered in the context of a patient's recent exposures and the presence of clinical signs and symptoms consistent with COVID-19. An individual without symptoms of COVID-19 and who is not shedding SARS-CoV-2 virus would expect to have a negati                          ve (not detected) result in this assay.     Allergies: Hydrocodone-acetaminophen and Zithromax [azithromycin]  Medications:  PTA Medications  Medication Sig  . Norethin-Eth Estradiol-Fe  [facility] FE) 0.4-35 MG-MCG tablet Chew 1 tablet by mouth daily. DX Z30.41  . oxyCODONE (ROXICODONE) 15 MG immediate release tablet Take 1 tablet (15 mg total) by mouth every 8 (eight) hours as needed for pain (as needed for severe pain).  Marland Kitchen escitalopram (LEXAPRO) 20 MG tablet Take 1 tablet (20 mg total) by mouth at bedtime.  . ALPRAZolam (XANAX) 0.5 MG tablet Take 0.5 mg by mouth at bedtime.  Marland Kitchen doxycycline (VIBRAMYCIN) 100 MG capsule Take 1 capsule (100 mg total) by mouth 2 (two) times daily.      Medical Decision Making  ***    Recommendations  Based on my evaluation the patient does not appear to have an emergency medical condition.  Patient will be admitted to [facility] cont observation for crisis management, safety and stabiluization.  Jasper Riling, NP 01/05/23  9:58 PM

## 2023-01-06 ENCOUNTER — Encounter (HOSPITAL_COMMUNITY): Payer: Self-pay | Admitting: Emergency Medicine

## 2023-01-06 LAB — SARS CORONAVIRUS 2 BY RT PCR: SARS Coronavirus 2 by RT PCR: NEGATIVE

## 2023-01-06 LAB — CBC WITH DIFFERENTIAL/PLATELET
Abs Immature Granulocytes: 0.02 10*3/uL (ref 0.00–0.07)
Basophils Absolute: 0.1 10*3/uL (ref 0.0–0.1)
Basophils Relative: 1 %
Eosinophils Absolute: 0.1 10*3/uL (ref 0.0–0.5)
Eosinophils Relative: 1 %
HCT: 39.8 % (ref 36.0–46.0)
Hemoglobin: 12.6 g/dL (ref 12.0–15.0)
Immature Granulocytes: 0 %
Lymphocytes Relative: 30 %
Lymphs Abs: 2.3 10*3/uL (ref 0.7–4.0)
MCH: 27.4 pg (ref 26.0–34.0)
MCHC: 31.7 g/dL (ref 30.0–36.0)
MCV: 86.5 fL (ref 80.0–100.0)
Monocytes Absolute: 0.6 10*3/uL (ref 0.1–1.0)
Monocytes Relative: 7 %
Neutro Abs: 4.8 10*3/uL (ref 1.7–7.7)
Neutrophils Relative %: 61 %
Platelets: 286 10*3/uL (ref 150–400)
RBC: 4.6 MIL/uL (ref 3.87–5.11)
RDW: 13 % (ref 11.5–15.5)
WBC: 7.8 10*3/uL (ref 4.0–10.5)
nRBC: 0 % (ref 0.0–0.2)

## 2023-01-06 LAB — COMPREHENSIVE METABOLIC PANEL WITH GFR
ALT: 12 U/L (ref 0–44)
AST: 14 U/L — ABNORMAL LOW (ref 15–41)
Albumin: 3.8 g/dL (ref 3.5–5.0)
Alkaline Phosphatase: 65 U/L (ref 38–126)
Anion gap: 10 (ref 5–15)
BUN: 15 mg/dL (ref 6–20)
CO2: 29 mmol/L (ref 22–32)
Calcium: 9.4 mg/dL (ref 8.9–10.3)
Chloride: 100 mmol/L (ref 98–111)
Creatinine, Ser: 0.95 mg/dL (ref 0.44–1.00)
GFR, Estimated: >60 mL/min
Glucose, Bld: 101 mg/dL — ABNORMAL HIGH (ref 70–99)
Potassium: 2.9 mmol/L — ABNORMAL LOW (ref 3.5–5.1)
Sodium: 139 mmol/L (ref 135–145)
Total Bilirubin: 0.6 mg/dL (ref 0.3–1.2)
Total Protein: 6.9 g/dL (ref 6.5–8.1)

## 2023-01-06 LAB — POCT URINE DRUG SCREEN - MANUAL ENTRY (I-SCREEN)
POC Amphetamine UR: NOT DETECTED
POC Buprenorphine (BUP): NOT DETECTED
POC Cocaine UR: POSITIVE — AB
POC Marijuana UR: POSITIVE — AB
POC Methadone UR: POSITIVE — AB
POC Methamphetamine UR: NOT DETECTED
POC Morphine: NOT DETECTED
POC Oxazepam (BZO): POSITIVE — AB
POC Oxycodone UR: NOT DETECTED
POC Secobarbital (BAR): NOT DETECTED

## 2023-01-06 LAB — LIPID PANEL
Cholesterol: 175 mg/dL (ref 0–200)
HDL: 60 mg/dL (ref >40)
LDL Cholesterol: 100 mg/dL — ABNORMAL HIGH (ref 0–99)
Total CHOL/HDL Ratio: 2.9 ratio
Triglycerides: 74 mg/dL (ref <150)
VLDL: 15 mg/dL (ref 0–40)

## 2023-01-06 LAB — POC URINE PREG, ED: Preg Test, Ur: NEGATIVE

## 2023-01-06 LAB — TSH: TSH: 2.913 u[IU]/mL (ref 0.350–4.500)

## 2023-01-06 LAB — POTASSIUM: Potassium: 3.7 mmol/L (ref 3.5–5.1)

## 2023-01-06 MED ORDER — ESCITALOPRAM OXALATE 10 MG PO TABS
20.0000 mg | ORAL_TABLET | Freq: Every day | ORAL | Status: DC
  Administered 2023-01-06 – 2023-01-07 (×2): 20 mg via ORAL
  Filled 2023-01-06 (×2): qty 2

## 2023-01-06 MED ORDER — ALPRAZOLAM 0.5 MG PO TABS: 1.0000 mg | ORAL_TABLET | Freq: Every day | ORAL | Status: DC

## 2023-01-06 MED ORDER — ALPRAZOLAM 0.25 MG PO TABS
0.2500 mg | ORAL_TABLET | Freq: Every day | ORAL | Status: DC
  Administered 2023-01-07: 0.25 mg via ORAL
  Filled 2023-01-06: qty 1

## 2023-01-06 MED ORDER — POTASSIUM CHLORIDE CRYS ER 20 MEQ PO TBCR: 40.0000 meq | EXTENDED_RELEASE_TABLET | Freq: Once | ORAL | Status: DC

## 2023-01-06 MED ORDER — POTASSIUM CHLORIDE CRYS ER 20 MEQ PO TBCR
40.0000 meq | EXTENDED_RELEASE_TABLET | ORAL | Status: AC
  Administered 2023-01-06 (×3): 40 meq via ORAL
  Filled 2023-01-06 (×3): qty 2

## 2023-01-06 MED ORDER — ALPRAZOLAM 0.5 MG PO TABS
0.5000 mg | ORAL_TABLET | Freq: Every day | ORAL | Status: DC
  Administered 2023-01-06: 0.5 mg via ORAL
  Filled 2023-01-06: qty 1

## 2023-01-06 NOTE — ED Notes (Signed)
Pt sleeping at present, no distress noted.  Respirations, even & unlabored.  Monitoring for safety.

## 2023-01-06 NOTE — ED Notes (Signed)
Patient observed/assessed in bed/chair resting quietly appearing in no distress and verbalizing no complaints at this time. Will continue to monitor.  

## 2023-01-06 NOTE — ED Notes (Signed)
Pt A&O x 4, sleeping at present, no distress noted.  Pt under IVC, Pending [facility] in AM.  Monitoring for safety.

## 2023-01-06 NOTE — ED Notes (Addendum)
Patient is visibly upset, requesting her methadone. This patient advised the patient I will have to speak with the provider. This nurse spoke with NP Patrice who advised that Pharmacy has not been able to verify the methadone or dosage with the patients methadone clinic. This nurse advised the patient of the following she said the methadone clinic is closed today and do not open until 5 am tomorrow. The patient asked for something to help with her anxiety.

## 2023-01-06 NOTE — BHH Counselor (Addendum)
Disposition Note. Patient has been accepted to [facility], pending potassium levels are addressed and therapeutic. Also, negative COVID results. It's been asked that those updated levels and results are faxed [phone]. The accepting provider is Dr. Ellamae Sia, Nurse report # [phone]. Patient is assigned to the [facility] A building upon arrival. Disposition updates provided to nursing staff.   Per [facility], nursing, patient will transfer 01/06/2023, as it was reported that sheriff does not transport on weekends. Old [name], made aware that patient will not transfer until 01/07/2023.

## 2023-01-06 NOTE — ED Notes (Signed)
Pt is currently sleeping, no distress noted, environmental check complete, will continue to monitor patient for safety.  

## 2023-01-06 NOTE — Discharge Instructions (Signed)
Transfer to [facility] building

## 2023-01-06 NOTE — ED Notes (Addendum)
Called sheriff transport to put patient on the list for transport to [facility], no answer, left voicemail at [phone]. 3 copies of [facility] paperwork put in folder. 1 copy in folder for medical records. Original in wall container.

## 2023-01-06 NOTE — BHH Counselor (Addendum)
Disposition Note. [facility] AC advised Disposition Counselor to fax patient out to other hospitals for review and consideration of bed placement. Patient was faxed to the following hospitals, see listed facilities below.   Destination  Service Provider Request Status Selected Services Address Phone Fax Patient Preferred  [facility]  Pending - Request Sent -- [address], [place] [address] [phone] --  [facility]  Pending - Request Rhina Brackett [place] [address] [phone] --  [facility]  Pending - Request Sent -- [address] Regino Bellow [place] [address] [phone] --  [facility]  Pending - Request Sent -- [address] [place] [address] [phone] --  [facility]  Pending - Request Sent -- [address] [place] [address] [phone] --  [facility]  Pending - Request Sent -- [address] [place], [place] [address] [phone] --  [facility]  Pending - Request Sent -- [address] [place] [address] [phone] --  [facility]  Pending - Request Sent -- [address], [place] [address] [phone] --  [facility]  Pending - Request Sent -- [address] [place] [address] [phone] --  [facility]  Pending - Request Sent -- [address] [place] [address] [phone] --  [facility]  Pending - Request Sent -- [address], [place] [address] [phone] --  [facility]  Pending - Request Sent -- [address] [name] [place] [address] [phone] --  [facility]  Pending - Request Sent -- [address] [name] [place] [phone] --  [facility]  Pending - Request Sent -- [address] [place] [address] [phone] --  [facility]  Pending - Request Sent -- [address], [place] [address] [phone] --  [facility]  Pending - Request Sent -- [address] [place] [address] [phone] --  [facility]  Pending - Request Sent -- [address] [place] [address] [phone] --  [facility]  Pending - Request Sent -- [address] [place], [place] [address] [phone] --  [facility]  Pending - Request Sent -- [address] [place] [address] [phone] --  [facility]  Pending - Request Sent -- [address] [place], [place] [address] [phone] --  [facility]  Pending - Request Sent -- [address] [place] [address] [phone] --  [facility]  Pending - Request Sent -- [address] [place] [address] [phone] --  [facility]  Pending - Request Sent -- [address], [place] [address] [phone] --  [facility]  Pending - No Request Sent -- [place] [phone]

## 2023-01-06 NOTE — ED Notes (Signed)
Patient was provided lunch.
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
## 2023-01-06 NOTE — BH Assessment (Signed)
Disposition Note. Patient meets criteria for inpatient psychiatric treatment and currently under review by the [facility] Sherry Ruffing, RN) for  admission to [facility] or [facility]. Disposition Counselor and Disposition Social Workers will continue to follow until bed placement is established.

## 2023-01-07 ENCOUNTER — Other Ambulatory Visit: Payer: Self-pay

## 2023-01-07 NOTE — ED Notes (Signed)
Patient in milieu. Environment is secured. Will continue to monitor for safety. 

## 2023-01-07 NOTE — ED Notes (Signed)
Sheriffs Transport to [facility] [employer], requested after 8am this morning.  Lab results Faxed to [facility].

## 2023-01-07 NOTE — ED Notes (Signed)
Patient in milieu. Environment is secured. Will continue to monitor for safety.Patient in milieu. Environment is secured. Will continue to monitor for safety.

## 2023-01-07 NOTE — ED Notes (Signed)
Rn called report to nurse at [facility]

## 2023-01-07 NOTE — ED Provider Notes (Signed)
[facility] has been accepted [facility] for [facility].  She is under IVC and will be transferred via [employer].   Patient is observed laying in her bed awake.  She is irritable upon approach and answers questions minimally. She blames her family for being brought to facility.  She is alert/oriented x 4, cooperative, and attentive.  She is demanding that she be given her methadone.  Explained the patient and methadone prescription could not be verified due to methadone clinic in [place] being closed for the weekend. She denies any withdrawal symptoms but states if she is not given her methadone soon she will begin to experience withdrawal symptoms. She is denying SI/HI/AVH.  She does not appear to be responding to internal/external stimuli. She does not believe she needs to be admitted to the psychiatric hospital, but is willing to go.

## 2023-01-07 NOTE — ED Notes (Signed)
Patient alert and oriented x 3. Denies SI/HI/AVH. Denies intent or plan to harm self or others. Routine conducted according to faculty protocol. Encourage patient to notify staff with any needs or concerns. Patient verbalized agreement and understanding. Will continue to monitor for safety. 

## 2023-01-07 NOTE — ED Notes (Signed)
Patient A&O x 4, ambulatory. Patient discharged in no acute distress. Patient denied SI/HI, A/VH upon discharge.  Pt belongings returned to patient from locker #  27  intact. Patient escorted to lobby via staff for transport to [facility] via [employer] fund[employer] maintained.

## 2023-01-07 NOTE — ED Notes (Signed)
Pt sleeping at present.  No distress noted.  Monitoring for safety.
